# Patient Record
Sex: Female | Born: 1980 | Race: Black or African American | Hispanic: No | Marital: Single | Smoking: Current some day smoker
Health system: Southern US, Community
[De-identification: ages and names within clinical notes are randomized; demographics above are authoritative.]

## PROBLEM LIST (undated history)

## (undated) DIAGNOSIS — D649 Anemia, unspecified: Secondary | ICD-10-CM

## (undated) DIAGNOSIS — F419 Anxiety disorder, unspecified: Secondary | ICD-10-CM

## (undated) DIAGNOSIS — F1910 Other psychoactive substance abuse, uncomplicated: Secondary | ICD-10-CM

## (undated) DIAGNOSIS — F32A Depression, unspecified: Secondary | ICD-10-CM

## (undated) DIAGNOSIS — F329 Major depressive disorder, single episode, unspecified: Secondary | ICD-10-CM

## (undated) DIAGNOSIS — N926 Irregular menstruation, unspecified: Secondary | ICD-10-CM

## (undated) DIAGNOSIS — F1410 Cocaine abuse, uncomplicated: Secondary | ICD-10-CM

## (undated) DIAGNOSIS — D571 Sickle-cell disease without crisis: Secondary | ICD-10-CM

## (undated) HISTORY — DX: Sickle-cell disease without crisis: D57.1

## (undated) HISTORY — PX: FOOT SURGERY: SHX648

## (undated) HISTORY — DX: Anemia, unspecified: D64.9

---

## 2008-09-09 ENCOUNTER — Emergency Department (HOSPITAL_COMMUNITY): Admission: EM | Admit: 2008-09-09 | Discharge: 2008-09-09 | Payer: Self-pay | Admitting: Emergency Medicine

## 2008-09-22 ENCOUNTER — Emergency Department (HOSPITAL_COMMUNITY): Admission: EM | Admit: 2008-09-22 | Discharge: 2008-09-22 | Payer: Self-pay | Admitting: Emergency Medicine

## 2008-10-27 ENCOUNTER — Emergency Department (HOSPITAL_COMMUNITY): Admission: EM | Admit: 2008-10-27 | Discharge: 2008-10-27 | Payer: Self-pay | Admitting: Emergency Medicine

## 2008-11-12 ENCOUNTER — Ambulatory Visit: Payer: Self-pay | Admitting: Internal Medicine

## 2008-11-16 ENCOUNTER — Ambulatory Visit: Payer: Self-pay | Admitting: *Deleted

## 2008-12-20 ENCOUNTER — Encounter: Payer: Self-pay | Admitting: Family Medicine

## 2008-12-20 ENCOUNTER — Ambulatory Visit: Payer: Self-pay | Admitting: Internal Medicine

## 2008-12-20 LAB — CONVERTED CEMR LAB
AST: 18 units/L (ref 0–37)
Albumin: 4.2 g/dL (ref 3.5–5.2)
BUN: 11 mg/dL (ref 6–23)
CO2: 22 meq/L (ref 19–32)
Calcium: 9.1 mg/dL (ref 8.4–10.5)
Chloride: 107 meq/L (ref 96–112)
Cholesterol: 133 mg/dL (ref 0–200)
Cocaine Metabolites: NEGATIVE
Creatinine,U: 190.8 mg/dL
Eosinophils Relative: 1 % (ref 0–5)
HCT: 39 % (ref 36.0–46.0)
HDL: 57 mg/dL (ref >39)
Hemoglobin: 12.9 g/dL (ref 12.0–15.0)
Lymphocytes Relative: 41 % (ref 12–46)
Lymphs Abs: 1.7 10*3/uL (ref 0.7–4.0)
MCV: 86.9 fL (ref 78.0–100.0)
Monocytes Absolute: 0.4 10*3/uL (ref 0.1–1.0)
Monocytes Relative: 9 % (ref 3–12)
Phencyclidine (PCP): NEGATIVE
Platelets: 335 10*3/uL (ref 150–400)
Potassium: 3.9 meq/L (ref 3.5–5.3)
RBC: 4.49 M/uL (ref 3.87–5.11)
TSH: 1.154 microintl units/mL (ref 0.350–4.500)
WBC: 4.1 10*3/uL (ref 4.0–10.5)

## 2008-12-27 ENCOUNTER — Encounter: Payer: Self-pay | Admitting: Family Medicine

## 2008-12-27 ENCOUNTER — Ambulatory Visit: Payer: Self-pay | Admitting: Internal Medicine

## 2008-12-27 LAB — CONVERTED CEMR LAB: Chlamydia, DNA Probe: NEGATIVE

## 2009-04-11 ENCOUNTER — Encounter: Payer: Self-pay | Admitting: Family Medicine

## 2009-04-11 ENCOUNTER — Ambulatory Visit: Payer: Self-pay | Admitting: Family Medicine

## 2009-04-11 LAB — CONVERTED CEMR LAB
Chlamydia, Swab/Urine, PCR: NEGATIVE
GC Probe Amp, Urine: POSITIVE — AB

## 2009-04-21 ENCOUNTER — Ambulatory Visit: Payer: Self-pay | Admitting: Internal Medicine

## 2009-08-03 ENCOUNTER — Emergency Department (HOSPITAL_COMMUNITY): Admission: EM | Admit: 2009-08-03 | Discharge: 2009-08-03 | Payer: Self-pay | Admitting: Emergency Medicine

## 2009-08-24 ENCOUNTER — Ambulatory Visit: Payer: Self-pay | Admitting: Internal Medicine

## 2009-08-25 ENCOUNTER — Encounter (INDEPENDENT_AMBULATORY_CARE_PROVIDER_SITE_OTHER): Payer: Self-pay | Admitting: Internal Medicine

## 2009-08-25 ENCOUNTER — Ambulatory Visit: Payer: Self-pay | Admitting: Family Medicine

## 2009-08-25 LAB — CONVERTED CEMR LAB
BUN: 12 mg/dL (ref 6–23)
CO2: 24 meq/L (ref 19–32)
Chloride: 104 meq/L (ref 96–112)
Creatinine, Ser: 0.8 mg/dL (ref 0.40–1.20)
Glucose, Bld: 100 mg/dL — ABNORMAL HIGH (ref 70–99)

## 2009-09-27 ENCOUNTER — Ambulatory Visit: Payer: Self-pay | Admitting: Internal Medicine

## 2009-10-24 ENCOUNTER — Ambulatory Visit: Payer: Self-pay | Admitting: Family Medicine

## 2009-10-24 ENCOUNTER — Encounter (INDEPENDENT_AMBULATORY_CARE_PROVIDER_SITE_OTHER): Payer: Self-pay | Admitting: Internal Medicine

## 2009-10-24 LAB — CONVERTED CEMR LAB
FSH: 1.9 milliintl units/mL
Ferritin: 25 ng/mL (ref 10–291)
Hgb A1c MFr Bld: 5.4 % (ref <5.7)
Preg, Serum: NEGATIVE

## 2009-11-21 ENCOUNTER — Ambulatory Visit: Payer: Self-pay | Admitting: Internal Medicine

## 2009-12-08 ENCOUNTER — Ambulatory Visit: Payer: Self-pay | Admitting: Internal Medicine

## 2009-12-13 ENCOUNTER — Ambulatory Visit: Payer: Self-pay | Admitting: Internal Medicine

## 2010-01-10 ENCOUNTER — Ambulatory Visit: Payer: Self-pay | Admitting: Internal Medicine

## 2010-01-26 ENCOUNTER — Ambulatory Visit: Payer: Self-pay | Admitting: Internal Medicine

## 2010-06-05 ENCOUNTER — Emergency Department (HOSPITAL_COMMUNITY)
Admission: EM | Admit: 2010-06-05 | Discharge: 2010-06-05 | Payer: Self-pay | Source: Home / Self Care | Admitting: Gastroenterology

## 2010-06-06 LAB — URINALYSIS, ROUTINE W REFLEX MICROSCOPIC
Bilirubin Urine: NEGATIVE
Ketones, ur: NEGATIVE mg/dL
Nitrite: NEGATIVE
Protein, ur: NEGATIVE mg/dL
Urobilinogen, UA: 1 mg/dL (ref 0.0–1.0)

## 2010-06-06 LAB — POCT PREGNANCY, URINE: Preg Test, Ur: NEGATIVE

## 2010-06-07 LAB — GC/CHLAMYDIA PROBE AMP, GENITAL: Chlamydia, DNA Probe: NEGATIVE

## 2010-12-22 ENCOUNTER — Inpatient Hospital Stay (HOSPITAL_COMMUNITY): Payer: Self-pay

## 2010-12-22 ENCOUNTER — Encounter (HOSPITAL_COMMUNITY): Payer: Self-pay | Admitting: *Deleted

## 2010-12-22 ENCOUNTER — Inpatient Hospital Stay (HOSPITAL_COMMUNITY)
Admission: AD | Admit: 2010-12-22 | Discharge: 2010-12-22 | Disposition: A | Discharge status: Home / Self Care | Payer: Self-pay | Source: Ambulatory Visit | Attending: Family Medicine | Admitting: Family Medicine

## 2010-12-22 DIAGNOSIS — N938 Other specified abnormal uterine and vaginal bleeding: Secondary | ICD-10-CM | POA: Insufficient documentation

## 2010-12-22 DIAGNOSIS — N719 Inflammatory disease of uterus, unspecified: Secondary | ICD-10-CM | POA: Insufficient documentation

## 2010-12-22 DIAGNOSIS — N83209 Unspecified ovarian cyst, unspecified side: Secondary | ICD-10-CM | POA: Insufficient documentation

## 2010-12-22 DIAGNOSIS — N949 Unspecified condition associated with female genital organs and menstrual cycle: Secondary | ICD-10-CM | POA: Insufficient documentation

## 2010-12-22 LAB — DIFFERENTIAL
Basophils Absolute: 0 10*3/uL (ref 0.0–0.1)
Basophils Relative: 0 % (ref 0–1)
Monocytes Relative: 8 % (ref 3–12)
Neutro Abs: 2.8 10*3/uL (ref 1.7–7.7)
Neutrophils Relative %: 47 % (ref 43–77)

## 2010-12-22 LAB — CBC
Hemoglobin: 12.9 g/dL (ref 12.0–15.0)
MCHC: 34.6 g/dL (ref 30.0–36.0)

## 2010-12-22 LAB — HCG, SERUM, QUALITATIVE: Preg, Serum: NEGATIVE

## 2010-12-22 LAB — URINALYSIS, ROUTINE W REFLEX MICROSCOPIC
Ketones, ur: NEGATIVE mg/dL
Nitrite: NEGATIVE
Urobilinogen, UA: 1 mg/dL (ref 0.0–1.0)
pH: 6 (ref 5.0–8.0)

## 2010-12-22 LAB — WET PREP, GENITAL: Clue Cells Wet Prep HPF POC: NONE SEEN

## 2010-12-22 LAB — URINE MICROSCOPIC-ADD ON

## 2010-12-22 LAB — POCT PREGNANCY, URINE: Preg Test, Ur: NEGATIVE

## 2010-12-22 MED ORDER — OXYCODONE-ACETAMINOPHEN 5-325 MG PO TABS: 1.0000 | ORAL_TABLET | ORAL | Status: AC | PRN

## 2010-12-22 MED ORDER — AMOXICILLIN-POT CLAVULANATE 875-125 MG PO TABS: 1.0000 | ORAL_TABLET | Freq: Two times a day (BID) | ORAL | Status: AC

## 2010-12-22 MED ORDER — IBUPROFEN 800 MG PO TABS: 800.0000 mg | ORAL_TABLET | Freq: Three times a day (TID) | ORAL | Status: AC

## 2010-12-22 MED ORDER — OXYCODONE-ACETAMINOPHEN 5-325 MG PO TABS
2.0000 | ORAL_TABLET | Freq: Once | ORAL | Status: AC
  Administered 2010-12-22: 2 via ORAL
  Filled 2010-12-22: qty 2

## 2010-12-22 NOTE — ED Provider Notes (Addendum)
History    Patient presents to MAU after having heavy bleeding x 3 weeks; she states she has had to change her pad every 2-3 hours. She states she was having her periods every month, at the beginning, but she has not had a period since May; she began bleeding the end of July, and has been bleeding since. She states initially she was passing large clots, and sometimes now. Pain has not been terrible. She thinks the odor to her bleeding and her vagina in general has been getting more foul smelling and strong in odor over the course of the past few weeks. She has also developed some pain and swelling in her breasts, particularly of the upper inner portion of her RT breast. She has noticed some milk leaking from her Right breast. She did breastfeed her only child, but has not breastfed in over a year. She denies any headaches or changes in vision. She admits to marijuana and cigarette use, and is a social drinker. States her partner is not circumcised, and she is concerned for having STD's; she is worried his penis also seems to have a foul smell to it. She denies fever, nausea, vomiting.  Chief Complaint  Patient presents with  . Vaginal Bleeding   HPI  No past medical history on file.  Past Surgical History  Procedure Date  . No past surgeries   . Cesarean section     FTP    No family history on file.  History  Substance Use Topics  . Smoking status: Current Everyday Smoker -- 0.5 packs/day    Types: Cigarettes  . Smokeless tobacco: Not on file  . Alcohol Use: Yes     social    Allergies: No Known Allergies  Prescriptions prior to admission  Medication Sig Dispense Refill  . ibuprofen (ADVIL,MOTRIN) 100 MG tablet Take 100 mg by mouth every 6 (six) hours as needed. For pain.         Review of Systems  Constitutional: Positive for malaise/fatigue. Negative for fever and chills.  Eyes: Negative for blurred vision and double vision.  Respiratory: Negative for cough.     Cardiovascular: Negative for chest pain.  Gastrointestinal: Positive for abdominal pain. Negative for heartburn, nausea and vomiting.  Genitourinary: Negative for dysuria and hematuria.       Vaginal bleeding x 3 weeks, irregular periods  Musculoskeletal: Negative.   Skin: Negative.   Neurological: Negative for dizziness, weakness and headaches.   Physical Exam   Blood pressure 123/77, pulse 78, temperature 97.8 F (36.6 C), temperature source Oral, resp. rate 16, height 5' 7.5" (1.715 m), weight 108.5 kg (239 lb 3.2 oz), SpO2 100.00%.  Physical Exam  Constitutional: She is oriented to person, place, and time. She appears well-developed and well-nourished. No distress.  HENT:  Head: Normocephalic.  Eyes: Pupils are equal, round, and reactive to light.  Neck: Normal range of motion.  Cardiovascular: Normal rate, regular rhythm, normal heart sounds and intact distal pulses.  Exam reveals no gallop.   No murmur heard. Respiratory: Effort normal and breath sounds normal. No respiratory distress. She has no wheezes. Right breast exhibits nipple discharge and tenderness. Left breast exhibits tenderness. Left breast exhibits no nipple discharge.    GI: Soft. Bowel sounds are normal. She exhibits no distension. There is no tenderness.  Genitourinary:       Uterus palpates to 16 wk size on bimanual, and is tender to palpation; both ovaries tender but no palpated as enlarged; cervical motion  tenderness.  Moderate amount of bleeding noted with some cervical discharge (clear) noted. No vaginal or cervical trauma noted.  Musculoskeletal: Normal range of motion.  Neurological: She is alert and oriented to person, place, and time.  Skin: Skin is warm and dry. She is not diaphoretic.       Scarring noted from ingrown hairs in axilla and in groin area  Psychiatric: She has a normal mood and affect.   Results for orders placed during the hospital encounter of 12/22/10 (from the past 24 hour(s))   URINALYSIS, ROUTINE W REFLEX MICROSCOPIC     Status: Abnormal   Collection Time   12/22/10  4:30 PM      Component Value Range   Color, Urine AMBER (*) YELLOW    Appearance CLOUDY (*) CLEAR    Specific Gravity, Urine 1.020  1.005 - 1.030    pH 6.0  5.0 - 8.0    Glucose, UA NEGATIVE  NEGATIVE (mg/dL)   Hgb urine dipstick LARGE (*) NEGATIVE    Bilirubin Urine SMALL (*) NEGATIVE    Ketones, ur NEGATIVE  NEGATIVE (mg/dL)   Protein, ur 30 (*) NEGATIVE (mg/dL)   Urobilinogen, UA 1.0  0.0 - 1.0 (mg/dL)   Nitrite NEGATIVE  NEGATIVE    Leukocytes, UA TRACE (*) NEGATIVE   URINE MICROSCOPIC-ADD ON     Status: Abnormal   Collection Time   12/22/10  4:30 PM      Component Value Range   Squamous Epithelial / LPF FEW (*) RARE    WBC, UA 0-2  <3 (WBC/hpf)   RBC / HPF TOO NUMEROUS TO COUNT  <3 (RBC/hpf)  POCT PREGNANCY, URINE     Status: Normal   Collection Time   12/22/10  4:43 PM      Component Value Range   Preg Test, Ur NEGATIVE    WET PREP, GENITAL     Status: Abnormal   Collection Time   12/22/10  6:20 PM      Component Value Range   Yeast, Wet Prep NONE SEEN  NONE SEEN    Trich, Wet Prep NONE SEEN  NONE SEEN    Clue Cells, Wet Prep NONE SEEN  NONE SEEN    WBC, Wet Prep HPF POC FEW (*) NONE SEEN   HCG, SERUM, QUALITATIVE     Status: Normal   Collection Time   12/22/10  6:59 PM      Component Value Range   Preg, Serum NEGATIVE  NEGATIVE   CBC     Status: Normal   Collection Time   12/22/10  6:59 PM      Component Value Range   WBC 5.9  4.0 - 10.5 (K/uL)   RBC 4.42  3.87 - 5.11 (MIL/uL)   Hemoglobin 12.9  12.0 - 15.0 (g/dL)   HCT 16.1  09.6 - 04.5 (%)   MCV 84.4  78.0 - 100.0 (fL)   MCH 29.2  26.0 - 34.0 (pg)   MCHC 34.6  30.0 - 36.0 (g/dL)   RDW 40.9  81.1 - 91.4 (%)   Platelets 306  150 - 400 (K/uL)  DIFFERENTIAL     Status: Normal   Collection Time   12/22/10  6:59 PM      Component Value Range   Neutrophils Relative 47  43 - 77 (%)   Neutro Abs 2.8  1.7 - 7.7 (K/uL)    Lymphocytes Relative 43  12 - 46 (%)   Lymphs Abs 2.6  0.7 - 4.0 (  K/uL)   Monocytes Relative 8  3 - 12 (%)   Monocytes Absolute 0.5  0.1 - 1.0 (K/uL)   Eosinophils Relative 1  0 - 5 (%)   Eosinophils Absolute 0.1  0.0 - 0.7 (K/uL)   Basophils Relative 0  0 - 1 (%)   Basophils Absolute 0.0  0.0 - 0.1 (K/uL)     MAU Course  Procedures [place]: IMPRESSION:  1. Normal endometrial thickness. Measures 4 mm.  2. Left ovary has a 4 cm cyst. Consider a follow-up ultrasound in 8  weeks. .  Assessment and Plan  1. Endometritis: Based on foul smell, increased bleeding and marked tenderness on examination. Will tx with Augment x 14 days. Percocet and Motrin 800mg  for pain. Will have clinic contact pt to arrange F/U. 2. Ovarian Cyst: Will need [place] in 8 weeks. Will arrange as outpt. 3. Lactating: Will need to follow clinically. Pt educated to use cool compresses and to not stimulate nipples. Pt may warrant Outpt [place] of breasts if persists, and/or prolactin level (would take 3 days at minimum to get back by what I was told by lab).  Chancy Milroy 12/22/2010, 6:27 PM

## 2010-12-22 NOTE — Progress Notes (Signed)
Pt states "R breast pain x 2wks, vag blding x3wks, changing pads 3-4 times a day"

## 2010-12-22 NOTE — Progress Notes (Signed)
Pt is wearing a full size pad since 1000 today. I/3 covered with dark blood. No active bleeding.

## 2010-12-22 NOTE — Progress Notes (Signed)
Pt states she has had heavy bleeding for 3 weeks, every day. Pt states she changes pads 2-3 pad per day. Noticed a breast lump on the top of her R breast about 2 weeks ago. Can be sore with palpation.

## 2010-12-23 LAB — TSH: TSH: 2.313 u[IU]/mL (ref 0.350–4.500)

## 2010-12-23 LAB — GC/CHLAMYDIA PROBE AMP, GENITAL: Chlamydia, DNA Probe: NEGATIVE

## 2011-01-25 ENCOUNTER — Emergency Department (HOSPITAL_COMMUNITY)
Admission: EM | Admit: 2011-01-25 | Discharge: 2011-01-25 | Disposition: A | Discharge status: Home / Self Care | Payer: No Typology Code available for payment source | Attending: Emergency Medicine | Admitting: Emergency Medicine

## 2011-01-25 ENCOUNTER — Emergency Department (HOSPITAL_COMMUNITY): Payer: No Typology Code available for payment source

## 2011-01-25 ENCOUNTER — Encounter: Payer: Self-pay | Admitting: Obstetrics and Gynecology

## 2011-01-25 ENCOUNTER — Ambulatory Visit (INDEPENDENT_AMBULATORY_CARE_PROVIDER_SITE_OTHER): Payer: Medicaid Other | Admitting: Obstetrics and Gynecology

## 2011-01-25 VITALS — BP 121/80 | HR 71 | Temp 98.0°F | Ht 68.0 in | Wt 237.2 lb

## 2011-01-25 DIAGNOSIS — B9689 Other specified bacterial agents as the cause of diseases classified elsewhere: Secondary | ICD-10-CM

## 2011-01-25 DIAGNOSIS — Z043 Encounter for examination and observation following other accident: Secondary | ICD-10-CM | POA: Insufficient documentation

## 2011-01-25 DIAGNOSIS — R109 Unspecified abdominal pain: Secondary | ICD-10-CM | POA: Insufficient documentation

## 2011-01-25 DIAGNOSIS — N760 Acute vaginitis: Secondary | ICD-10-CM

## 2011-01-25 DIAGNOSIS — A499 Bacterial infection, unspecified: Secondary | ICD-10-CM

## 2011-01-25 DIAGNOSIS — O99891 Other specified diseases and conditions complicating pregnancy: Secondary | ICD-10-CM | POA: Insufficient documentation

## 2011-01-25 LAB — HCG, QUANTITATIVE, PREGNANCY: hCG, Beta Chain, Quant, S: 1 m[IU]/mL (ref <5)

## 2011-01-25 LAB — WET PREP, GENITAL: Yeast Wet Prep HPF POC: NONE SEEN

## 2011-01-25 MED ORDER — METRONIDAZOLE 500 MG PO TABS: 500.0000 mg | ORAL_TABLET | Freq: Two times a day (BID) | ORAL | Status: AC

## 2011-01-29 NOTE — Progress Notes (Signed)
Subjective: Patient is today to followup on ovarian cyst. She states her pain has now resolved. Her only complaint is vaginal discharge with odor. She has no other complaints at this time.  Objective: Patient is alert and oriented x3 in no apparent distress. Vital signs are stable.  Her abdomen is soft and nontender to palpation. She does have normal external female genitalia. He has a very thin vaginal discharge present. Wet prep was performed and was negative. Bimanual examination reveals a normal shape and size uterus. There are no adnexal masses. Patient is nontender on examination.  Assessment and plan: At this time her pain from a prior ovarian cyst has resolved. No further workup is needed at this time. If she begins to have worsening pain or additional symptoms, she will need a repeat ultrasound. She will followup for her next yearly physical examination and Pap smear.  Clinton Gallant. Rice III, DrHSc, MPAS, PA-C

## 2011-02-22 ENCOUNTER — Encounter (HOSPITAL_COMMUNITY): Payer: Self-pay

## 2011-02-22 ENCOUNTER — Inpatient Hospital Stay (HOSPITAL_COMMUNITY)
Admission: AD | Admit: 2011-02-22 | Discharge: 2011-02-22 | Disposition: A | Discharge status: Home / Self Care | Payer: Medicaid Other | Source: Ambulatory Visit | Attending: Obstetrics & Gynecology | Admitting: Obstetrics & Gynecology

## 2011-02-22 DIAGNOSIS — N926 Irregular menstruation, unspecified: Secondary | ICD-10-CM

## 2011-02-22 DIAGNOSIS — Z711 Person with feared health complaint in whom no diagnosis is made: Secondary | ICD-10-CM | POA: Insufficient documentation

## 2011-02-22 HISTORY — DX: Major depressive disorder, single episode, unspecified: F32.9

## 2011-02-22 HISTORY — DX: Depression, unspecified: F32.A

## 2011-02-22 LAB — URINALYSIS, ROUTINE W REFLEX MICROSCOPIC
Glucose, UA: NEGATIVE mg/dL
Leukocytes, UA: NEGATIVE
Protein, ur: NEGATIVE mg/dL

## 2011-02-22 LAB — URINE MICROSCOPIC-ADD ON

## 2011-02-22 NOTE — ED Provider Notes (Signed)
Agree with above note.  Ashdon Gillson H. 02/22/2011 5:20 PM

## 2011-02-22 NOTE — Progress Notes (Signed)
Pt states she does not know her LMP. Started bleeding this am. Pt has tissue on and it is saturated with dark red blood. Pt states she did a HPT that was POS about one week ago. No pain.

## 2011-02-22 NOTE — Progress Notes (Signed)
Pt states bleeding began this am, appeared to be light tissue matter per pt that fell into commode. Last intercourse 3 weeks ago. Denies pain at present. Small smear on pad now, was just changed in restroom before being brought into MAU bed. No pnc as yet. Denies vaginal d/c changes. No clots. Bleeding began at 0200, heavy, has changed x4 since coming in to be evaluated. Home upt+ on Friday, 12/23/2010.

## 2011-02-22 NOTE — ED Provider Notes (Signed)
Alicia Flores [id number] Chief Complaint  Patient presents with  . Vaginal Bleeding    SUBJECTIVE  HPI: Concerned that she began menstrual-like bleeding today and passed tissue and clots this am. Mild menstrual-like cramps. Thinks she was pregnant last month when seen here and quant beta hCG was 1. No pain or irritative vaginal discharge.   Past Medical History  Diagnosis Date  . Anemia   . Sickle cell anemia     sickle cell trait  . Depression     Past Surgical History  Procedure Date  . Cesarean section     FTP   History   Social History  . Marital Status: Single    Spouse Name: N/A    Number of Children: N/A  . Years of Education: N/A   Occupational History  . Not on file.   Social History Main Topics  . Smoking status: Former Smoker -- 0.5 packs/day    Types: Cigarettes    Quit date: 01/11/2011  . Smokeless tobacco: Never Used  . Alcohol Use: No     social  . Drug Use: No  . Sexually Active: Yes    Birth Control/ Protection: None   Other Topics Concern  . Not on file   Social History Narrative  . No narrative on file   No current facility-administered medications on file prior to encounter.   No current outpatient prescriptions on file prior to encounter.   No Known Allergies  ROS: Pertinent items in HPI  OBJECTIVE  BP 111/72  Pulse 83  Temp(Src) 98 F (36.7 C) (Oral)  Resp 20  Ht 5\' 7"  (1.702 m)  Wt 107.684 kg (237 lb 6.4 oz)  BMI 37.18 kg/m2  SpO2 99%  LMP 10/08/2010   Physical Exam   General: WN/WD in NAD Abd: soft, NT  UPT negative  ASSESSMENT  Normal menses onset today   PLAN  Reassured pt.

## 2011-02-23 LAB — POCT PREGNANCY, URINE: Preg Test, Ur: NEGATIVE

## 2011-03-26 ENCOUNTER — Encounter (HOSPITAL_COMMUNITY): Payer: Self-pay | Admitting: Emergency Medicine

## 2011-03-26 ENCOUNTER — Emergency Department (HOSPITAL_COMMUNITY)
Admission: EM | Admit: 2011-03-26 | Discharge: 2011-03-26 | Discharge status: Against Medical Advice | Payer: Self-pay | Attending: Emergency Medicine | Admitting: Emergency Medicine

## 2011-03-26 DIAGNOSIS — R109 Unspecified abdominal pain: Secondary | ICD-10-CM | POA: Insufficient documentation

## 2011-03-26 HISTORY — DX: Anxiety disorder, unspecified: F41.9

## 2011-03-26 NOTE — ED Notes (Signed)
Per ems pt c/o abd pain x 1 week.

## 2011-03-29 ENCOUNTER — Other Ambulatory Visit: Payer: Self-pay

## 2011-03-29 ENCOUNTER — Encounter (HOSPITAL_COMMUNITY): Payer: Self-pay | Admitting: Emergency Medicine

## 2011-03-29 ENCOUNTER — Emergency Department (HOSPITAL_COMMUNITY)
Admission: EM | Admit: 2011-03-29 | Discharge: 2011-03-29 | Disposition: A | Discharge status: Other Acute Inpatient Hospital | Payer: Self-pay | Attending: Emergency Medicine | Admitting: Emergency Medicine

## 2011-03-29 DIAGNOSIS — M79606 Pain in leg, unspecified: Secondary | ICD-10-CM

## 2011-03-29 DIAGNOSIS — F341 Dysthymic disorder: Secondary | ICD-10-CM | POA: Insufficient documentation

## 2011-03-29 DIAGNOSIS — M79609 Pain in unspecified limb: Secondary | ICD-10-CM | POA: Insufficient documentation

## 2011-03-29 DIAGNOSIS — Z1331 Encounter for screening for depression: Secondary | ICD-10-CM | POA: Insufficient documentation

## 2011-03-29 LAB — COMPREHENSIVE METABOLIC PANEL
BUN: 23 mg/dL (ref 6–23)
CO2: 23 mEq/L (ref 19–32)
Chloride: 103 mEq/L (ref 96–112)
Creatinine, Ser: 1.05 mg/dL (ref 0.50–1.10)
GFR calc non Af Amer: 72 mL/min — ABNORMAL LOW (ref >90)
Glucose, Bld: 82 mg/dL (ref 70–99)
Total Bilirubin: 1.3 mg/dL — ABNORMAL HIGH (ref 0.3–1.2)

## 2011-03-29 LAB — URINALYSIS, DIPSTICK ONLY
Glucose, UA: NEGATIVE mg/dL
Hgb urine dipstick: NEGATIVE
Ketones, ur: 40 mg/dL — AB
Protein, ur: NEGATIVE mg/dL

## 2011-03-29 LAB — CBC
Platelets: 283 10*3/uL (ref 150–400)
RBC: 4.96 MIL/uL (ref 3.87–5.11)
WBC: 5.8 10*3/uL (ref 4.0–10.5)

## 2011-03-29 LAB — POCT PREGNANCY, URINE: Preg Test, Ur: NEGATIVE

## 2011-03-29 MED ORDER — IBUPROFEN 800 MG PO TABS
800.0000 mg | ORAL_TABLET | Freq: Once | ORAL | Status: AC
  Administered 2011-03-29: 800 mg via ORAL
  Filled 2011-03-29: qty 1

## 2011-03-29 MED ORDER — IBUPROFEN 600 MG PO TABS: 600.0000 mg | ORAL_TABLET | Freq: Four times a day (QID) | ORAL | Status: AC | PRN

## 2011-03-29 NOTE — ED Notes (Signed)
Pt is from [facility] and brought with ivp papers and officer to get lab work. Pt denies and si or hi states that she was just walking the streets and she has bil foot pain with no injury.no swelling noted, states that she has no medical problems and has never had any med hx per pt.

## 2011-03-29 NOTE — ED Provider Notes (Signed)
History    patient was sent to the ED from [facility] for clearance lab work for placement. Per note, patient has IVC paper. Patient denies suicidal or homicidal ideation. She does complain of bilateral knee pain thigh pain since yesterday. She describes the pain as aching and throbbing, or relieved with rest. She denies swelling, rash. She denies fever, chest pain, shortness of breath. She is not on any birth control pill and denies any recent surgery. She states she has been walking a lot. CSN: [id number] Arrival date & time: 03/29/2011  9:28 AM   First MD Initiated Contact with Patient 03/29/11 1005      Chief Complaint  Patient presents with  . Medical Clearance    (Consider location/radiation/quality/duration/timing/severity/associated sxs/prior treatment) HPI  Past Medical History  Diagnosis Date  . Anemia   . Sickle cell anemia     sickle cell trait  . Depression   . Anxiety     Past Surgical History  Procedure Date  . Cesarean section     FTP    Family History  Problem Relation Age of Onset  . Hypertension Mother     History  Substance Use Topics  . Smoking status: Former Smoker -- 0.5 packs/day    Types: Cigarettes    Quit date: 01/11/2011  . Smokeless tobacco: Never Used  . Alcohol Use: No     social    OB History    Grav Para Term Preterm Abortions TAB SAB Ect Mult Living   3 1 1  1  1   1       Review of Systems  All other systems reviewed and are negative.    Allergies  Review of patient's allergies indicates no known allergies.  Home Medications  No current outpatient prescriptions on file.  BP 112/77  Pulse 84  Temp(Src) 98.1 F (36.7 C) (Oral)  Resp 16  SpO2 100%  LMP 03/19/2011  Physical Exam  Nursing note and vitals reviewed. Constitutional:       Awake, alert, nontoxic appearance  HENT:  Head: Atraumatic.  Eyes: Right eye exhibits no discharge. Left eye exhibits no discharge.  Neck: Neck supple.  Cardiovascular: Normal rate  and regular rhythm.   Pulmonary/Chest: Effort normal and breath sounds normal. She exhibits no tenderness.  Abdominal: Soft. There is no tenderness. There is no rebound.  Musculoskeletal: Normal range of motion. She exhibits no tenderness.       Baseline ROM, no obvious new focal weakness.  Knee DTR intact.  No obvious rash or overlying skin changes to lower extremities.  No calf tenderness or edema noted.     Neurological:       Mental status and motor strength appears baseline for patient and situation  Skin: No rash noted.  Psychiatric: Her speech is normal. Thought content normal. Her affect is blunt. She is withdrawn.    ED Course  Procedures (including critical care time)  Labs Reviewed - No data to display No results found.   No diagnosis found.    MDM  Will obtain clearance lab work as requested by [facility].  Patient has bilateral lower thigh pain with no obvious finding. She is PERC negative.  Pain is musculoskeletal likely secondary to walking. Doubt DVT or  Infection. No joint pain noted. Ibuprofen given.  2:35 PM Pt is medically cleared.  Will discharge back to [facility].         Fayrene Helper, PA 03/29/11 1435

## 2011-03-29 NOTE — ED Notes (Signed)
Pt left with Marketing executive to be transported back to [facility].  Amelia from [place] the transport

## 2011-03-30 NOTE — ED Provider Notes (Signed)
Medical screening examination/treatment/procedure(s) were performed by non-physician practitioner and as supervising physician I was immediately available for consultation/collaboration.   Kensi Karr M Rubye Strohmeyer, DO 03/30/11 1908 

## 2012-01-28 ENCOUNTER — Encounter (HOSPITAL_COMMUNITY): Payer: Self-pay | Admitting: *Deleted

## 2012-01-28 ENCOUNTER — Inpatient Hospital Stay (HOSPITAL_COMMUNITY)
Admission: AD | Admit: 2012-01-28 | Discharge: 2012-01-28 | Disposition: A | Discharge status: Home / Self Care | Payer: Medicaid Other | Source: Ambulatory Visit | Attending: Family Medicine | Admitting: Family Medicine

## 2012-01-28 DIAGNOSIS — N939 Abnormal uterine and vaginal bleeding, unspecified: Secondary | ICD-10-CM

## 2012-01-28 DIAGNOSIS — Z201 Contact with and (suspected) exposure to tuberculosis: Secondary | ICD-10-CM | POA: Insufficient documentation

## 2012-01-28 DIAGNOSIS — N938 Other specified abnormal uterine and vaginal bleeding: Secondary | ICD-10-CM | POA: Insufficient documentation

## 2012-01-28 DIAGNOSIS — N949 Unspecified condition associated with female genital organs and menstrual cycle: Secondary | ICD-10-CM | POA: Insufficient documentation

## 2012-01-28 LAB — URINALYSIS, ROUTINE W REFLEX MICROSCOPIC
Bilirubin Urine: NEGATIVE
Nitrite: NEGATIVE
Protein, ur: NEGATIVE mg/dL
Specific Gravity, Urine: 1.015 (ref 1.005–1.030)
Urobilinogen, UA: 0.2 mg/dL (ref 0.0–1.0)

## 2012-01-28 LAB — POCT PREGNANCY, URINE: Preg Test, Ur: NEGATIVE

## 2012-01-28 LAB — URINE MICROSCOPIC-ADD ON

## 2012-01-28 MED ORDER — TUBERCULIN PPD 5 UNIT/0.1ML ID SOLN
5.0000 [IU] | Freq: Once | INTRADERMAL | Status: AC
  Administered 2012-01-28: 5 [IU] via INTRADERMAL
  Filled 2012-01-28: qty 0.1

## 2012-01-28 NOTE — MAU Provider Note (Signed)
Chart reviewed and agree with management and plan.  

## 2012-01-28 NOTE — MAU Provider Note (Signed)
Chief Complaint: Possible Pregnancy and Vaginal Bleeding   First Provider Initiated Contact with Patient 01/28/12 1426     SUBJECTIVE HPI: Alicia Flores is a 31 y.o. G2P1011 by Unknown LMP who presents to MAU reporting onset of moderate bleeding today. She thinks she is pregnant due to fatigue, pedal edema, increased appetite and abnormal interval btw menstrual cycle other she does not on her last menstrual cycle occurred. Pt is poor historian. Denies abd pain, passage of tissue or clots. Has not taken UPT. Is sexually active, no BC.    When phlebotomist came to room to draw blood she noticed that the patient was coughing. Inquired about cause. Patient reports exposure TB. In same room several x over past two weeks w/ person that she states has been hospitalized at [facility] for TB.   Past Medical History  Diagnosis Date  . Anemia   . Sickle cell anemia     sickle cell trait  . Depression   . Anxiety    OB History    Grav Para Term Preterm Abortions TAB SAB Ect Mult Living   3 1 1  1  1   1      # Outc Date GA Lbr Len/2nd Wgt Sex Del Anes PTL Lv   1 SAB            2 TRM            3 GRA              Past Surgical History  Procedure Date  . Cesarean section     FTP   History   Social History  . Marital Status: Single    Spouse Name: N/A    Number of Children: N/A  . Years of Education: N/A   Occupational History  . Not on file.   Social History Main Topics  . Smoking status: Former Smoker -- 0.5 packs/day    Types: Cigarettes    Quit date: 01/11/2011  . Smokeless tobacco: Never Used  . Alcohol Use: No     social  . Drug Use: No  . Sexually Active: Yes    Birth Control/ Protection: None   Other Topics Concern  . Not on file   Social History Narrative  . No narrative on file   No current facility-administered medications on file prior to encounter.   Current Outpatient Prescriptions on File Prior to Encounter  Medication Sig Dispense Refill  . QUEtiapine Fumarate  (SEROQUEL PO) Take 1 tablet by mouth daily.       No Known Allergies  ROS: Pertinent items in HPI  OBJECTIVE Blood pressure 116/80, pulse 80, temperature 98.7 F (37.1 C), temperature source Oral, resp. rate 18, height 5' 7.5" (1.715 m), weight 123.288 kg (271 lb 12.8 oz), last menstrual period 01/28/2012, SpO2 100.00%. GENERAL: Well-developed, well-nourished female in no acute distress.  HEENT: Normocephalic HEART: normal rate RESP: normal effort ABDOMEN: Soft, non-tender EXTREMITIES: Nontender, no edema NEURO: Alert and oriented SPECULUM EXAM: deferred. Small amount of BRB on pad. Pelvic exam deferred due to neg UPT, suspected menses.  LAB RESULTS Results for orders placed during the hospital encounter of 01/28/12 (from the past 24 hour(s))  URINALYSIS, ROUTINE W REFLEX MICROSCOPIC     Status: Abnormal   Collection Time   01/28/12 12:10 PM      Component Value Range   Color, Urine YELLOW  YELLOW   APPearance CLEAR  CLEAR   Specific Gravity, Urine 1.015  1.005 - 1.030  pH 5.5  5.0 - 8.0   Glucose, UA NEGATIVE  NEGATIVE mg/dL   Hgb urine dipstick LARGE (*) NEGATIVE   Bilirubin Urine NEGATIVE  NEGATIVE   Ketones, ur NEGATIVE  NEGATIVE mg/dL   Protein, ur NEGATIVE  NEGATIVE mg/dL   Urobilinogen, UA 0.2  0.0 - 1.0 mg/dL   Nitrite NEGATIVE  NEGATIVE   Leukocytes, UA NEGATIVE  NEGATIVE  URINE MICROSCOPIC-ADD ON     Status: Normal   Collection Time   01/28/12 12:10 PM      Component Value Range   Squamous Epithelial / LPF RARE  RARE   RBC / HPF TOO NUMEROUS TO COUNT  <3 RBC/hpf  POCT PREGNANCY, URINE     Status: Normal   Collection Time   01/28/12 12:20 PM      Component Value Range   Preg Test, Ur NEGATIVE  NEGATIVE    IMAGING No results found.  ED COURSE Consulted with Melissa with infection control. She called [facility] to investigate patient's reported TB exposure. Patient to [facility] has not been diagnosed with or suspected of having TB. PPD performed per  patient's request in maternity admissions.  ASSESSMENT 1. Abnormal uterine bleeding   2. 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 In 2 days to have PPD read.   Contact information:   [address] [place] [address] [phone]      Follow up with Gynecologist . (As needed if cycles continue to be abnormal)       Follow up with [facility] ED. (for worsening cough, fever, night sweats)    Contact information:   [address] [place] [address]           Medication List     As of 01/28/2012  9:00 PM    TAKE these medications         ibuprofen 200 MG tablet   Commonly known as: ADVIL,MOTRIN   Take 400 mg by mouth every 6 (six) hours as needed.      SEROQUEL PO   Take 1 tablet by mouth daily.         [place], [place] 01/28/2012  2:37 PM

## 2012-01-28 NOTE — MAU Note (Addendum)
Charna Archer, RN paged in infection control to consult about PPD being read.  Awaiting return call.  Returned call with Charna Archer ref. Info on person pt was possibly exposed with TB.  Infection control will investigate and report back to RN with findings.

## 2012-01-28 NOTE — MAU Note (Signed)
Patient states her period was late and started early this am. Thinks she might be pregnant. Has a mild left lower abdominal pain.

## 2012-06-17 ENCOUNTER — Emergency Department (HOSPITAL_COMMUNITY)
Admission: EM | Admit: 2012-06-17 | Discharge: 2012-06-18 | Disposition: A | Discharge status: Home / Self Care | Payer: Medicaid Other | Attending: Emergency Medicine | Admitting: Emergency Medicine

## 2012-06-17 ENCOUNTER — Emergency Department (HOSPITAL_COMMUNITY): Payer: Medicaid Other

## 2012-06-17 ENCOUNTER — Encounter (HOSPITAL_COMMUNITY): Payer: Self-pay

## 2012-06-17 DIAGNOSIS — F3289 Other specified depressive episodes: Secondary | ICD-10-CM | POA: Insufficient documentation

## 2012-06-17 DIAGNOSIS — Z8659 Personal history of other mental and behavioral disorders: Secondary | ICD-10-CM | POA: Insufficient documentation

## 2012-06-17 DIAGNOSIS — M545 Low back pain, unspecified: Secondary | ICD-10-CM | POA: Insufficient documentation

## 2012-06-17 DIAGNOSIS — Z862 Personal history of diseases of the blood and blood-forming organs and certain disorders involving the immune mechanism: Secondary | ICD-10-CM | POA: Insufficient documentation

## 2012-06-17 DIAGNOSIS — Z79899 Other long term (current) drug therapy: Secondary | ICD-10-CM | POA: Insufficient documentation

## 2012-06-17 DIAGNOSIS — M542 Cervicalgia: Secondary | ICD-10-CM | POA: Insufficient documentation

## 2012-06-17 DIAGNOSIS — Z87891 Personal history of nicotine dependence: Secondary | ICD-10-CM | POA: Insufficient documentation

## 2012-06-17 DIAGNOSIS — F329 Major depressive disorder, single episode, unspecified: Secondary | ICD-10-CM | POA: Insufficient documentation

## 2012-06-17 DIAGNOSIS — IMO0002 Reserved for concepts with insufficient information to code with codable children: Secondary | ICD-10-CM | POA: Insufficient documentation

## 2012-06-17 DIAGNOSIS — F1210 Cannabis abuse, uncomplicated: Secondary | ICD-10-CM | POA: Insufficient documentation

## 2012-06-17 LAB — RAPID URINE DRUG SCREEN, HOSP PERFORMED: Benzodiazepines: NOT DETECTED

## 2012-06-17 LAB — PREGNANCY, URINE: Preg Test, Ur: NEGATIVE

## 2012-06-17 LAB — CBC WITH DIFFERENTIAL/PLATELET
Eosinophils Relative: 0 % (ref 0–5)
HCT: 40.1 % (ref 36.0–46.0)
Hemoglobin: 13.7 g/dL (ref 12.0–15.0)
Lymphocytes Relative: 33 % (ref 12–46)
Lymphs Abs: 1.6 10*3/uL (ref 0.7–4.0)
MCV: 81.5 fL (ref 78.0–100.0)
Monocytes Relative: 8 % (ref 3–12)
Platelets: 408 10*3/uL — ABNORMAL HIGH (ref 150–400)
RBC: 4.92 MIL/uL (ref 3.87–5.11)
WBC: 4.7 10*3/uL (ref 4.0–10.5)

## 2012-06-17 LAB — COMPREHENSIVE METABOLIC PANEL
ALT: 8 U/L (ref 0–35)
Alkaline Phosphatase: 50 U/L (ref 39–117)
CO2: 23 mEq/L (ref 19–32)
Calcium: 9 mg/dL (ref 8.4–10.5)
GFR calc Af Amer: 88 mL/min — ABNORMAL LOW (ref >90)
GFR calc non Af Amer: 76 mL/min — ABNORMAL LOW (ref >90)
Glucose, Bld: 89 mg/dL (ref 70–99)
Sodium: 136 mEq/L (ref 135–145)

## 2012-06-17 LAB — ETHANOL: Alcohol, Ethyl (B): <11 mg/dL (ref 0–11)

## 2012-06-17 MED ORDER — LORAZEPAM 1 MG PO TABS
1.0000 mg | ORAL_TABLET | Freq: Three times a day (TID) | ORAL | Status: DC | PRN
  Administered 2012-06-17: 1 mg via ORAL
  Filled 2012-06-17: qty 1

## 2012-06-17 MED ORDER — ZIPRASIDONE MESYLATE 20 MG IM SOLR: 10.0000 mg | Freq: Once | INTRAMUSCULAR | Status: DC

## 2012-06-17 NOTE — ED Notes (Signed)
Patient very slow to answer questions asked. Patient denies HI/SI. Patient did say that she smoked marijuana this AM. Patient denies verbal or auditory hallucinations.

## 2012-06-17 NOTE — BH Assessment (Signed)
Assessment Note   Alicia Flores is a 32 y.o. female who initially presented to [facility] with c/o chest pain. This writer attempted to assess pt, however pt is incoherent and unable to respond to questions from this Clinical research associate.  The following information is collateral and pt will require further evaluation by Dr. Elsie Saas.  Pt allegedly was refusing to answer questions from medical staff and not able to follow commands.  Pt has poor eye contact with this Clinical research associate and is incoherent, only able to provide one word answers and repeatedly states--"I hurt, I hurt all over".  Pt does not appear agitated but is confused, when asked if she knows where she is, pt says---"they tell me the hospital".  Pt denies SI/HI and denies AVH, has hx of depression and anxiety.  Pt has been able to follow simple commands from the psych emerg dept nurse and has been cooperative.  Pt is drowsy and was falling asleep during assessment.  Pt will need further evaluation from on-site psychiatrist to determine final disposition.   Axis I: Substance Induced Mood Disorder Axis II: Deferred Axis III:  Past Medical History  Diagnosis Date  . Anemia   . Sickle cell anemia     sickle cell trait  . Depression   . Anxiety    Axis IV: other psychosocial or environmental problems, problems related to social environment and problems with primary support group Axis V: 41-50 serious symptoms  Past Medical History:  Past Medical History  Diagnosis Date  . Anemia   . Sickle cell anemia     sickle cell trait  . Depression   . Anxiety     Past Surgical History  Procedure Date  . Cesarean section     FTP    Family History:  Family History  Problem Relation Age of Onset  . Hypertension Mother     Social History:  reports that she quit smoking about 17 months ago. Her smoking use included Cigarettes. She smoked .5 packs per day. She has never used smokeless tobacco. She reports that she uses illicit drugs (Marijuana). She reports that  she does not drink alcohol.  Additional Social History:  Alcohol / Drug Use Pain Medications: None  Prescriptions: None  Over the Counter: None  History of alcohol / drug use?: Yes Longest period of sobriety (when/how long): None  Substance #1 Name of Substance 1: THC  1 - Age of First Use: Unk  1 - Amount (size/oz): Unk  1 - Frequency: Unk  1 - Duration: Unk  1 - Last Use / Amount: Unk   CIWA: CIWA-Ar BP: 104/69 mmHg Pulse Rate: 57  COWS:    Allergies: No Known Allergies  Home Medications:  (Not in a hospital admission)  OB/GYN Status:  Patient's last menstrual period was 06/11/2012.  General Assessment Data Location of Assessment: [facility] Living Arrangements: Alone Can pt return to current living arrangement?: Yes Admission Status: Voluntary Is patient capable of signing voluntary admission?: Yes Transfer from: Acute Hospital Referral Source: MD  Education Status Is patient currently in school?: No Current Grade: None  Highest grade of school patient has completed: None  Name of school: None  Contact person: None   Risk to self Suicidal Ideation: No Suicidal Intent: No Is patient at risk for suicide?: No Suicidal Plan?: No Access to Means: No What has been your use of drugs/alcohol within the last 12 months?: Abusing: THC  Previous Attempts/Gestures: No How many times?: 0  Other Self Harm Risks: None  Triggers for Past Attempts: None known Intentional Self Injurious Behavior: None Family Suicide History: No Recent stressful life event(s): Other (Comment) (None Reported ) Persecutory voices/beliefs?: No Depression: No Depression Symptoms:  (None reported ) Substance abuse history and/or treatment for substance abuse?: Yes Suicide prevention information given to non-admitted patients: Not applicable  Risk to Others Homicidal Ideation: No Thoughts of Harm to Others: No Current Homicidal Intent: No Current Homicidal Plan: No Access to Homicidal Means:  No Identified Victim: None  History of harm to others?: No Assessment of Violence: None Noted Violent Behavior Description: None  Does patient have access to weapons?: No Criminal Charges Pending?: No Does patient have a court date: No  Psychosis Hallucinations: None noted Delusions: None noted  Mental Status Report Appear/Hygiene: Disheveled Eye Contact: Poor Motor Activity: Other (Comment) (walking slow with ER RN escort) Speech: Slow;Soft;Incoherent Level of Consciousness: Drowsy Mood: Other (Comment) (UTA) Affect: Unable to Assess Anxiety Level: None Thought Processes: Relevant Judgement: Impaired Orientation: Person;Place;Time;Situation Obsessive Compulsive Thoughts/Behaviors: None  Cognitive Functioning Concentration: Decreased Memory: Recent Impaired;Remote Intact IQ: Average Insight: Fair Impulse Control: Fair Appetite: Good Weight Loss: 0  Weight Gain: 0  Sleep: No Change Total Hours of Sleep: 6  Vegetative Symptoms: None  ADLScreening [facility]) Patient's cognitive ability adequate to safely complete daily activities?: Yes Patient able to express need for assistance with ADLs?: Yes Independently performs ADLs?: Yes (appropriate for developmental age)  Abuse/Neglect [facility]) Physical Abuse: Denies Verbal Abuse: Denies Sexual Abuse: Denies  Prior Inpatient Therapy Prior Inpatient Therapy: No Prior Therapy Dates: None  Prior Therapy Facilty/Provider(s): None  Reason for Treatment: None   Prior Outpatient Therapy Prior Outpatient Therapy: No Prior Therapy Dates: None  Prior Therapy Facilty/Provider(s): None  Reason for Treatment: None   ADL Screening (condition at time of admission) Patient's cognitive ability adequate to safely complete daily activities?: Yes Patient able to express need for assistance with ADLs?: Yes Independently performs ADLs?: Yes (appropriate for developmental age) Weakness of Legs: None Weakness of  Arms/Hands: None  Home Assistive Devices/Equipment Home Assistive Devices/Equipment: None  Therapy Consults (therapy consults require a physician order) PT Evaluation Needed: No OT Evalulation Needed: No SLP Evaluation Needed: No Abuse/Neglect Assessment (Assessment to be complete while patient is alone) Physical Abuse: Denies Verbal Abuse: Denies Sexual Abuse: Denies Exploitation of patient/patient's resources: Denies Self-Neglect: Denies Values / Beliefs Cultural Requests During Hospitalization: None Spiritual Requests During Hospitalization: None Consults Spiritual Care Consult Needed: No Social Work Consult Needed: No Merchant navy officer (For Healthcare) Advance Directive: Patient does not have advance directive;Patient would not like information Pre-existing out of facility DNR order (yellow form or pink MOST form): No Nutrition Screen- [facility] Patient's home diet: Regular Have you recently lost weight without trying?: No Have you been eating poorly because of a decreased appetite?: No Malnutrition Screening Tool Score: 0   Additional Information 1:1 In Past 12 Months?: No CIRT Risk: No Elopement Risk: No Does patient have medical clearance?: Yes     Disposition:  Disposition Disposition of Patient: Referred to (Pending eval by Dr. Elsie Saas ) Patient referred to: Other (Comment) (Pending Eval by Dr. Elsie Saas )  On Site Evaluation by:   Reviewed with Physician:     Murrell Redden 06/17/2012 11:09 PM

## 2012-06-17 NOTE — ED Provider Notes (Addendum)
History     CSN: [id number]  Arrival date & time 06/17/12  1418   First MD Initiated Contact with Patient 06/17/12 1457      Chief Complaint  Patient presents with  . Medical Clearance  . Back Pain    (Consider location/radiation/quality/duration/timing/severity/associated sxs/prior treatment) HPI Comments: Patient brought to the ER by EMS. It is not clear why she is here. Patient is refusing to answer any questions. The original EMS call was reportedly for chest pain, but she denied chest pain to EMS when they arrived. She did reportedly tell EMS she was having low back pain, but by the time she arrived to the ER she is no longer answering questions. Patient does not make eye contact. She will not follow any commands. He appears agitated and is tearful.  Patient is a 32 y.o. female presenting with back pain.  Back Pain     Past Medical History  Diagnosis Date  . Anemia   . Sickle cell anemia     sickle cell trait  . Depression   . Anxiety     Past Surgical History  Procedure Date  . Cesarean section     FTP    Family History  Problem Relation Age of Onset  . Hypertension Mother     History  Substance Use Topics  . Smoking status: Former Smoker -- 0.5 packs/day    Types: Cigarettes    Quit date: 01/11/2011  . Smokeless tobacco: Never Used  . Alcohol Use: No     Comment: social    OB History    Grav Para Term Preterm Abortions TAB SAB Ect Mult Living   3 1 1  1  1   1       Review of Systems  Unable to perform ROS Musculoskeletal: Positive for back pain.    Allergies  Review of patient's allergies indicates no known allergies.  Home Medications   Current Outpatient Rx  Name  Route  Sig  Dispense  Refill  . IBUPROFEN 200 MG PO TABS   Oral   Take 400 mg by mouth every 6 (six) hours as needed.         . SEROQUEL PO   Oral   Take 1 tablet by mouth daily.           LMP 06/11/2012  Physical Exam  Constitutional: She is oriented to person,  place, and time. She appears well-developed and well-nourished. No distress.  HENT:  Head: Normocephalic and atraumatic.  Right Ear: Hearing normal.  Nose: Nose normal.  Mouth/Throat: Oropharynx is clear and moist and mucous membranes are normal.  Eyes: Conjunctivae normal and EOM are normal. Pupils are equal, round, and reactive to light.  Neck: Normal range of motion. Neck supple.  Cardiovascular: Normal rate, regular rhythm, S1 normal and S2 normal.  Exam reveals no gallop and no friction rub.   No murmur heard. Pulmonary/Chest: Effort normal and breath sounds normal. No respiratory distress. She exhibits no tenderness.  Abdominal: Soft. Normal appearance and bowel sounds are normal. There is no hepatosplenomegaly. There is no tenderness. There is no rebound, no guarding, no tenderness at McBurney's point and negative Murphy's sign. No hernia.  Musculoskeletal: Normal range of motion.  Neurological: She is alert and oriented to person, place, and time. She has normal strength. No cranial nerve deficit or sensory deficit. Coordination normal. GCS eye subscore is 4. GCS verbal subscore is 5. GCS motor subscore is 6.  Skin: Skin is warm, dry  and intact. No rash noted. No cyanosis.  Psychiatric: Her mood appears anxious. She is agitated. She is noncommunicative.    ED Course  Procedures (including critical care time)   Date: 06/17/2012  Rate: 110  Rhythm: sinus tachycardia  QRS Axis: normal  Intervals: normal  ST/T Wave abnormalities: normal  Conduction Disutrbances:none  Narrative Interpretation:   Old EKG Reviewed: unchanged    Labs Reviewed  CBC WITH DIFFERENTIAL - Abnormal; Notable for the following:    Platelets 408 (*)     All other components within normal limits  COMPREHENSIVE METABOLIC PANEL - Abnormal; Notable for the following:    GFR calc non Af Amer 76 (*)     GFR calc Af Amer 88 (*)     All other components within normal limits  URINE RAPID DRUG SCREEN (HOSP  PERFORMED) - Abnormal; Notable for the following:    Tetrahydrocannabinol POSITIVE (*)     All other components within normal limits  ETHANOL  PREGNANCY, URINE   Ct Head Wo Contrast  06/17/2012  *RADIOLOGY REPORT*  Clinical Data:  Altered mental status  CT HEAD WITHOUT CONTRAST CT CERVICAL SPINE WITHOUT CONTRAST  Technique:  Multidetector CT imaging of the head and cervical spine was performed following the standard protocol without intravenous contrast.  Multiplanar CT image reconstructions of the cervical spine were also generated.  Comparison:   None  CT HEAD  Findings: No mass effect, midline shift, or acute intracranial hemorrhage.  Brain parenchyma and ventricles system are within normal limits.  Mastoid air cells are clear.  Cranium is intact.  IMPRESSION: Negative head CT.  CT CERVICAL SPINE  Findings: No acute fracture.  No dislocation.  Reversal of the normally lordotic cervical spine.  No obvious soft tissue injury. Unremarkable thyroid gland.  IMPRESSION: No acute bony injury in the cervical spine.   Original Report Authenticated By: Jolaine Click, M.D.    Ct Cervical Spine Wo Contrast  06/17/2012  *RADIOLOGY REPORT*  Clinical Data:  Altered mental status  CT HEAD WITHOUT CONTRAST CT CERVICAL SPINE WITHOUT CONTRAST  Technique:  Multidetector CT imaging of the head and cervical spine was performed following the standard protocol without intravenous contrast.  Multiplanar CT image reconstructions of the cervical spine were also generated.  Comparison:   None  CT HEAD  Findings: No mass effect, midline shift, or acute intracranial hemorrhage.  Brain parenchyma and ventricles system are within normal limits.  Mastoid air cells are clear.  Cranium is intact.  IMPRESSION: Negative head CT.  CT CERVICAL SPINE  Findings: No acute fracture.  No dislocation.  Reversal of the normally lordotic cervical spine.  No obvious soft tissue injury. Unremarkable thyroid gland.  IMPRESSION: No acute bony injury in the  cervical spine.   Original Report Authenticated By: Jolaine Click, M.D.      Diagnosis: Mental status changes    MDM  Process to the ER for evaluation by EMS. He was not clear why she came to the ER initially. She reportedly called EMS is that she had chest pain, but when they arrived she denied it. She did tell EMS that she was experiencing back pain, but on arrival she wouldn't answer any questions. Eventually she did consent to treatment and denies back pain but said she was having neck pain. Patient is very evasive, intermittently does answer questions at all. She becomes tearful when spoken to. Her workup has been entirely unremarkable. I believe her condition is primarily psychiatric and therefore she will be moved  to the psychiatric ER the area for further psychiatric evaluation.       Gilda Crease, MD 06/17/12 1910  Gilda Crease, MD 06/18/12 [phone]

## 2012-06-17 NOTE — ED Notes (Addendum)
Per EMS Original call came for chest pain and when EMS arrived, patient denied having CP. Patient could not recall her name when asked. Patient would not or could not answer any questions. When questioned, patient c/o low back pain. Patient has a history of sickle cell. Patient also stated that she smoked marijuana this AM.

## 2012-06-17 NOTE — ED Notes (Signed)
Patient had a pocket knife and was given to [facility].

## 2012-06-17 NOTE — ED Notes (Signed)
2 bags in locker 35

## 2012-06-18 ENCOUNTER — Inpatient Hospital Stay (HOSPITAL_COMMUNITY): Admission: AD | Admit: 2012-06-18 | Payer: Medicaid Other | Source: Intra-hospital | Admitting: Psychiatry

## 2012-06-18 NOTE — ED Provider Notes (Signed)
Patient brought in yesterday for unclear reason.  EDP determined it was likely psychiatric and patient with thc in uds.  Patient tells me she had chest pain.  She denies any current pain.  She states she has a caregiver who is out of town.  She is unclear why she has a caregiver.  She denies suicidality or homicidal intention.  She denies any psychotic symptoms.  She states she is treated for depression but denies any worsening of symptoms.  Patient feels she can go home if we can find her money.   Plan to arrange for transport home.  Patient advised to follow up with her mental health care provider.    Hilario Quarry, MD 06/18/12 [phone]

## 2012-06-19 ENCOUNTER — Encounter (HOSPITAL_COMMUNITY): Payer: Self-pay | Admitting: Emergency Medicine

## 2012-06-19 ENCOUNTER — Emergency Department (HOSPITAL_COMMUNITY)
Admission: EM | Admit: 2012-06-19 | Discharge: 2012-06-20 | Disposition: A | Discharge status: Home / Self Care | Payer: Medicaid Other | Attending: Emergency Medicine | Admitting: Emergency Medicine

## 2012-06-19 DIAGNOSIS — F1210 Cannabis abuse, uncomplicated: Secondary | ICD-10-CM | POA: Insufficient documentation

## 2012-06-19 DIAGNOSIS — Z87891 Personal history of nicotine dependence: Secondary | ICD-10-CM | POA: Insufficient documentation

## 2012-06-19 DIAGNOSIS — F29 Unspecified psychosis not due to a substance or known physiological condition: Secondary | ICD-10-CM | POA: Insufficient documentation

## 2012-06-19 DIAGNOSIS — Z862 Personal history of diseases of the blood and blood-forming organs and certain disorders involving the immune mechanism: Secondary | ICD-10-CM | POA: Insufficient documentation

## 2012-06-19 DIAGNOSIS — Z8659 Personal history of other mental and behavioral disorders: Secondary | ICD-10-CM | POA: Insufficient documentation

## 2012-06-19 LAB — RAPID URINE DRUG SCREEN, HOSP PERFORMED
Amphetamines: NOT DETECTED
Barbiturates: NOT DETECTED
Benzodiazepines: NOT DETECTED
Cocaine: NOT DETECTED
Opiates: NOT DETECTED
Tetrahydrocannabinol: POSITIVE — AB

## 2012-06-19 LAB — CBC WITH DIFFERENTIAL/PLATELET
Basophils Absolute: 0 10*3/uL (ref 0.0–0.1)
Basophils Relative: 0 % (ref 0–1)
Eosinophils Absolute: 0 10*3/uL (ref 0.0–0.7)
Eosinophils Relative: 0 % (ref 0–5)
HCT: 40.5 % (ref 36.0–46.0)
Hemoglobin: 14 g/dL (ref 12.0–15.0)
Lymphocytes Relative: 29 % (ref 12–46)
Lymphs Abs: 2.1 10*3/uL (ref 0.7–4.0)
MCH: 28.1 pg (ref 26.0–34.0)
MCHC: 34.6 g/dL (ref 30.0–36.0)
MCV: 81.3 fL (ref 78.0–100.0)
Monocytes Absolute: 0.5 10*3/uL (ref 0.1–1.0)
Monocytes Relative: 7 % (ref 3–12)
Neutro Abs: 4.5 10*3/uL (ref 1.7–7.7)
Neutrophils Relative %: 64 % (ref 43–77)
Platelets: 413 10*3/uL — ABNORMAL HIGH (ref 150–400)
RBC: 4.98 MIL/uL (ref 3.87–5.11)
RDW: 13.6 % (ref 11.5–15.5)
WBC: 7.1 10*3/uL (ref 4.0–10.5)

## 2012-06-19 LAB — URINALYSIS, ROUTINE W REFLEX MICROSCOPIC
Glucose, UA: NEGATIVE mg/dL
Leukocytes, UA: NEGATIVE
Specific Gravity, Urine: 1.02 (ref 1.005–1.030)
pH: 5.5 (ref 5.0–8.0)

## 2012-06-19 LAB — BASIC METABOLIC PANEL
BUN: 11 mg/dL (ref 6–23)
CO2: 25 mEq/L (ref 19–32)
Calcium: 9.4 mg/dL (ref 8.4–10.5)
Chloride: 101 mEq/L (ref 96–112)
Creatinine, Ser: 1.03 mg/dL (ref 0.50–1.10)
GFR calc Af Amer: 84 mL/min — ABNORMAL LOW (ref >90)
GFR calc non Af Amer: 73 mL/min — ABNORMAL LOW (ref >90)
Glucose, Bld: 118 mg/dL — ABNORMAL HIGH (ref 70–99)
Potassium: 4.1 mEq/L (ref 3.5–5.1)
Sodium: 137 mEq/L (ref 135–145)

## 2012-06-19 LAB — ETHANOL: Alcohol, Ethyl (B): <11 mg/dL (ref 0–11)

## 2012-06-19 LAB — ACETAMINOPHEN LEVEL: Acetaminophen (Tylenol), Serum: <15 ug/mL (ref 10–30)

## 2012-06-19 NOTE — ED Provider Notes (Signed)
History     CSN: [id number]  Arrival date & time 06/19/12  1807   First MD Initiated Contact with Patient 06/19/12 1934      Chief Complaint  Patient presents with  . Altered Mental Status     The history is provided by the patient and the EMS personnel. The history is limited by the condition of the patient (psychosis).  Pt was seen at 1245.  Per EMS report, pt came to the ED today due to AMS.  Pt states she smoked some marijuana today.  Intermittently laughs and says "it hurts" and "I need help."  Pt does have hx of unknown mental illness, states she "is supposed to see" a mental health provider monthly but "hasn't really."  States she "isn't sure" if she is supposed to be taking psychiatric medications.  Denies HI, no SI, no SA. Pt was in the ED 2 days ago with similar symptoms.  Pt was also admitted to [facility] under IVC approx 3 months ago but "can't remember why."    Past Medical History  Diagnosis Date  . Anemia   . Sickle cell anemia     sickle cell trait  . Depression   . Anxiety     Past Surgical History  Procedure Date  . Cesarean section     FTP    Family History  Problem Relation Age of Onset  . Hypertension Mother     History  Substance Use Topics  . Smoking status: Former Smoker -- 0.5 packs/day    Types: Cigarettes    Quit date: 01/11/2011  . Smokeless tobacco: Never Used  . Alcohol Use: No     Comment: social    OB History    Grav Para Term Preterm Abortions TAB SAB Ect Mult Living   3 1 1  1  1   1       Review of Systems  Unable to perform ROS: Psychiatric disorder      Allergies  Review of patient's allergies indicates no known allergies.  Home Medications  No current outpatient prescriptions on file.  LMP 06/11/2012  Physical Exam 2050: Physical examination:  Nursing notes reviewed; Vital signs and O2 SAT reviewed;  Constitutional: Well developed, Well nourished, Well hydrated, In no acute distress; Head:  Normocephalic,  atraumatic; Eyes: EOMI, PERRL, No scleral icterus; ENMT: Mouth and pharynx normal, Mucous membranes moist; Neck: Supple, Full range of motion; Cardiovascular: Regular rate and rhythm, No gallop; Respiratory: Breath sounds clear & equal bilaterally, No wheezes.  Speaking full sentences with ease, Normal respiratory effort/excursion; Chest: Nontender, Movement normal; Abdomen: Soft, Nontender, Nondistended, Normal bowel sounds;; Extremities: Pulses normal, No deformity, No edema, No calf edema or asymmetry.; Neuro: Awake, alert, confused. Major CN grossly intact.  Speech clear. No facial droop. No gross focal motor or sensory deficits in extremities.; Skin: Color normal, Warm, Dry.; Psych:  Inappropriate affect, laughing at times, looking away and talking to the wall, appears to be responding to internal stimuli.     ED Course  Procedures    MDM  MDM Reviewed: nursing note, vitals and previous chart Interpretation: labs   Results for orders placed during the hospital encounter of 06/19/12  CBC WITH DIFFERENTIAL      Component Value Range   WBC 7.1  4.0 - 10.5 K/uL   RBC 4.98  3.87 - 5.11 MIL/uL   Hemoglobin 14.0  12.0 - 15.0 g/dL   HCT 24.4  01.0 - 27.2 %   MCV 81.3  78.0 - 100.0 fL   MCH 28.1  26.0 - 34.0 pg   MCHC 34.6  30.0 - 36.0 g/dL   RDW 96.2  95.2 - 84.1 %   Platelets 413 (*) 150 - 400 K/uL   Neutrophils Relative 64  43 - 77 %   Neutro Abs 4.5  1.7 - 7.7 K/uL   Lymphocytes Relative 29  12 - 46 %   Lymphs Abs 2.1  0.7 - 4.0 K/uL   Monocytes Relative 7  3 - 12 %   Monocytes Absolute 0.5  0.1 - 1.0 K/uL   Eosinophils Relative 0  0 - 5 %   Eosinophils Absolute 0.0  0.0 - 0.7 K/uL   Basophils Relative 0  0 - 1 %   Basophils Absolute 0.0  0.0 - 0.1 K/uL  BASIC METABOLIC PANEL      Component Value Range   Sodium 137  135 - 145 mEq/L   Potassium 4.1  3.5 - 5.1 mEq/L   Chloride 101  96 - 112 mEq/L   CO2 25  19 - 32 mEq/L   Glucose, Bld 118 (*) 70 - 99 mg/dL   BUN 11  6 - 23  mg/dL   Creatinine, Ser 3.24  0.50 - 1.10 mg/dL   Calcium 9.4  8.4 - 40.1 mg/dL   GFR calc non Af Amer 73 (*) >90 mL/min   GFR calc Af Amer 84 (*) >90 mL/min  URINE RAPID DRUG SCREEN (HOSP PERFORMED)      Component Value Range   Opiates NONE DETECTED  NONE DETECTED   Cocaine NONE DETECTED  NONE DETECTED   Benzodiazepines NONE DETECTED  NONE DETECTED   Amphetamines NONE DETECTED  NONE DETECTED   Tetrahydrocannabinol POSITIVE (*) NONE DETECTED   Barbiturates NONE DETECTED  NONE DETECTED  ETHANOL      Component Value Range   Alcohol, Ethyl (B) <11  0 - 11 mg/dL  URINALYSIS, ROUTINE W REFLEX MICROSCOPIC      Component Value Range   Color, Urine YELLOW  YELLOW   APPearance CLOUDY (*) CLEAR   Specific Gravity, Urine 1.020  1.005 - 1.030   pH 5.5  5.0 - 8.0   Glucose, UA NEGATIVE  NEGATIVE mg/dL   Hgb urine dipstick NEGATIVE  NEGATIVE   Bilirubin Urine NEGATIVE  NEGATIVE   Ketones, ur TRACE (*) NEGATIVE mg/dL   Protein, ur NEGATIVE  NEGATIVE mg/dL   Urobilinogen, UA 1.0  0.0 - 1.0 mg/dL   Nitrite NEGATIVE  NEGATIVE   Leukocytes, UA NEGATIVE  NEGATIVE  PREGNANCY, URINE      Component Value Range   Preg Test, Ur NEGATIVE  NEGATIVE  SALICYLATE LEVEL      Component Value Range   Salicylate Lvl <2.0 (*) 2.8 - 20.0 mg/dL  ACETAMINOPHEN LEVEL      Component Value Range   Acetaminophen (Tylenol), Serum <15.0  10 - 30 ug/mL    0130:  Pt continues to talk and laugh to herself.  2nd ED visit for same.  Unclear mental health hx.  Telepsych eval pending.            Laray Anger, DO 06/20/12 [phone]

## 2012-06-19 NOTE — ED Notes (Signed)
Patient reports smoking marijuana today and says she needs some help.  Patient laughing denying any pain.  Denies any other drug use.

## 2012-06-19 NOTE — ED Notes (Signed)
Tele- psych consult initiated and paperwork faxed. Estimated wait time is approx 3hours.

## 2012-06-19 NOTE — ED Provider Notes (Signed)
  Physical Exam  LMP 06/11/2012  Physical Exam  ED Course  Procedures  Alicia Flores is a 32 y.o. female who presents to the Emergency Department with no stated complaints.  Pt is laughing and is unable to form any full sentences.  Per triage, pt admitted to smoking marijuana earlier today.  Pt is obviously hallucinating and completely unable to answer any questions during exam.  Pt is being sent back for further assessment to evaluate psychosis.      Carlyle Dolly, PA-C 06/19/12 2108

## 2012-06-20 DIAGNOSIS — Z7189 Other specified counseling: Secondary | ICD-10-CM

## 2012-06-20 DIAGNOSIS — F1994 Other psychoactive substance use, unspecified with psychoactive substance-induced mood disorder: Secondary | ICD-10-CM

## 2012-06-20 DIAGNOSIS — F1220 Cannabis dependence, uncomplicated: Secondary | ICD-10-CM

## 2012-06-20 DIAGNOSIS — F1210 Cannabis abuse, uncomplicated: Secondary | ICD-10-CM

## 2012-06-20 MED ORDER — LORAZEPAM 1 MG PO TABS: 1.0000 mg | ORAL_TABLET | Freq: Three times a day (TID) | ORAL | Status: DC | PRN

## 2012-06-20 NOTE — ED Notes (Signed)
Gave water 

## 2012-06-20 NOTE — ED Provider Notes (Signed)
Pt stable awaiting placement  Alicia Lennert, MD 06/20/12 [phone]
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
## 2012-06-20 NOTE — ED Notes (Signed)
One bag in locker 40 

## 2012-06-20 NOTE — [facility] Suicide Risk Assessment (Signed)
Suicide Risk Assessment  Discharge Assessment     Demographic Factors:  Adolescent or young adult, Low socioeconomic status and Living alone  Mental Status Per Nursing Assessment::   On Admission:     Current Mental Status by Physician: Patient denies recent onset ideation intention or plans. Patient has no evidence of psychotic symptoms  Loss Factors: Financial problems/change in socioeconomic status  Historical Factors: Impulsivity  Risk Reduction Factors:   Responsible for children under 63 years of age, Sense of responsibility to family, Religious beliefs about death, Living with another person, especially a relative and Positive social support  Continued Clinical Symptoms:  Depression:   Anhedonia Comorbid alcohol abuse/dependence Insomnia Recent sense of peace/wellbeing Severe Alcohol/Substance Abuse/Dependencies  Cognitive Features That Contribute To Risk:  Polarized thinking    Suicide Risk:  Minimal: No identifiable suicidal ideation.  Patients presenting with no risk factors but with morbid ruminations; may be classified as minimal risk based on the severity of the depressive symptoms  Discharge Diagnoses:   AXIS I:  Substance Induced Mood Disorder and Cannabis abuse AXIS II:  Deferred AXIS III:   Past Medical History  Diagnosis Date  . Anemia   . Sickle cell anemia     sickle cell trait  . Depression   . Anxiety    AXIS IV:  economic problems, occupational problems, other psychosocial or environmental problems and problems related to social environment AXIS V:  41-50 serious symptoms  Plan Of Care/Follow-up recommendations:  Activity:  As tolerated Diet:  Regular  Is patient on multiple antipsychotic therapies at discharge:  No   Has Patient had three or more failed trials of antipsychotic monotherapy by history:  No  Recommended Plan for Multiple Antipsychotic Therapies: Not applicable  Edwing Figley,JANARDHAHA R. 06/20/2012, 12:11 PM

## 2012-06-20 NOTE — ED Provider Notes (Signed)
Medical screening examination/treatment/procedure(s) were performed by non-physician practitioner and as supervising physician I was immediately available for consultation/collaboration. Devoria Albe, MD, Armando Gang   Ward Givens, MD 06/20/12 [phone]

## 2012-06-23 ENCOUNTER — Encounter (HOSPITAL_COMMUNITY): Payer: Self-pay | Admitting: *Deleted

## 2012-06-23 ENCOUNTER — Emergency Department (HOSPITAL_COMMUNITY)
Admission: EM | Admit: 2012-06-23 | Discharge: 2012-06-23 | Disposition: A | Discharge status: Home / Self Care | Payer: Medicaid Other | Attending: Emergency Medicine | Admitting: Emergency Medicine

## 2012-06-23 DIAGNOSIS — R110 Nausea: Secondary | ICD-10-CM | POA: Insufficient documentation

## 2012-06-23 DIAGNOSIS — Z862 Personal history of diseases of the blood and blood-forming organs and certain disorders involving the immune mechanism: Secondary | ICD-10-CM | POA: Insufficient documentation

## 2012-06-23 DIAGNOSIS — Z87891 Personal history of nicotine dependence: Secondary | ICD-10-CM | POA: Insufficient documentation

## 2012-06-23 DIAGNOSIS — Z8659 Personal history of other mental and behavioral disorders: Secondary | ICD-10-CM | POA: Insufficient documentation

## 2012-06-23 DIAGNOSIS — R51 Headache: Secondary | ICD-10-CM | POA: Insufficient documentation

## 2012-06-23 MED ORDER — METOCLOPRAMIDE HCL 10 MG PO TABS
10.0000 mg | ORAL_TABLET | Freq: Once | ORAL | Status: AC
  Administered 2012-06-23: 10 mg via ORAL
  Filled 2012-06-23: qty 1

## 2012-06-23 MED ORDER — KETOROLAC TROMETHAMINE 30 MG/ML IJ SOLN
30.0000 mg | Freq: Once | INTRAMUSCULAR | Status: AC
  Administered 2012-06-23: 30 mg via INTRAMUSCULAR
  Filled 2012-06-23: qty 1

## 2012-06-23 NOTE — ED Notes (Addendum)
Pt states she has head discomfort, "feels like swelling" She is unable to open eyes, states she is very sensitive to light and sound. Denies n/v. States she had difficulty walking earlier today causing her to stubble. She is sitting on the bed with her eyes closed as she is asked questions about her symptoms.

## 2012-06-23 NOTE — ED Notes (Signed)
Per EMS non emergent call was dispatched to patient house. Pt was sitting on the front porch. Per EMS, she was asked cc: states that she had a headache. Pt refused to elaborate on sx's. vulger language expressed and disrespectful to EMT.

## 2012-06-23 NOTE — ED Provider Notes (Signed)
History    32 year old female with headache. Gradual onset about a day ago. Denies trauma. Headache is the frontal region of the face. Feels like pressure and that her head is going to "crack open." Symptoms constant. No appreciable exacerbating relieving factors. No fever or chills. Denies significant past headache history. No sick contacts. No use of blood thinning medication. Mild nausea, but no vomiting. No acute visual complaints. No acute numbness, tingling or loss of strength. No intervention prior to arrival.  CSN: [id number]  Arrival date & time 06/23/12  0350   First MD Initiated Contact with Patient 06/23/12 0413      Chief Complaint  Patient presents with  . Headache    (Consider location/radiation/quality/duration/timing/severity/associated sxs/prior treatment) HPI  Past Medical History  Diagnosis Date  . Anemia   . Sickle cell anemia     sickle cell trait  . Depression   . Anxiety     Past Surgical History  Procedure Laterality Date  . Cesarean section      FTP    Family History  Problem Relation Age of Onset  . Hypertension Mother     History  Substance Use Topics  . Smoking status: Former Smoker -- 0.50 packs/day    Types: Cigarettes    Quit date: 01/11/2011  . Smokeless tobacco: Never Used  . Alcohol Use: No     Comment: social    OB History   Grav Para Term Preterm Abortions TAB SAB Ect Mult Living   3 1 1  1  1   1       Review of Systems  All systems reviewed and negative, other than as noted in HPI.   Allergies  Review of patient's allergies indicates no known allergies.  Home Medications  No current outpatient prescriptions on file.  BP 121/69  Pulse 85  Temp(Src) 98.8 F (37.1 C) (Oral)  Resp 20  SpO2 100%  LMP 06/11/2012  Physical Exam  Nursing note and vitals reviewed. Constitutional: She appears well-developed and well-nourished. No distress.  Laying in bed. No acute distress. Obese.  HENT:  Head: Normocephalic and  atraumatic.  Mouth/Throat: Oropharynx is clear and moist.  Eyes: Conjunctivae and EOM are normal. Pupils are equal, round, and reactive to light. Right eye exhibits no discharge. Left eye exhibits no discharge.  Neck: Neck supple.  No nuchal rigidity  Cardiovascular: Normal rate, regular rhythm and normal heart sounds.  Exam reveals no gallop and no friction rub.   No murmur heard. Pulmonary/Chest: Effort normal and breath sounds normal. No respiratory distress.  Abdominal: Soft. She exhibits no distension. There is no tenderness.  Musculoskeletal: She exhibits no edema and no tenderness.  Neurological: She is alert. No cranial nerve deficit. She exhibits normal muscle tone. Coordination normal.  Good finger to nose testing bilaterally. Gait is steady  Skin: Skin is warm and dry. She is not diaphoretic.  Psychiatric: Her behavior is normal. Thought content normal.    ED Course  Procedures (including critical care time)  Labs Reviewed - No data to display No results found.   1. Headache       MDM  31yF with headache. Suspect primary HA. Consider emergent secondary causes such as bleed, infectious or mass but doubt. There is no history of trauma. Pt has a nonfocal neurological exam. Afebrile and neck supple. No use of blood thinning medication. Consider ocular etiology such as acute angle closure glaucoma but doubt. Pt denies acute change in visual acuity and eye exam unremarkable.  Doubt CO poisoning. No contacts with similar symptoms. Doubt venous thrombosis. Doubt carotid or vertebral arteries dissection. Symptoms improved with meds but still having "pressure." Pt would like a CT scan. No indication for this. Pt requesting transfer to another facility. No need. Pt is being discharged. Return precautions discussed. Outpt fu.         Raeford Razor, MD 06/23/12 [phone]

## 2012-06-23 NOTE — ED Notes (Signed)
[id number]<AV> Expected date:06/23/12<BR> Expected time: 3:37 AM<BR> Means of arrival:Ambulance<BR> Comments:<BR> RM 9: Headache

## 2012-06-29 ENCOUNTER — Encounter (HOSPITAL_COMMUNITY): Payer: Self-pay | Admitting: *Deleted

## 2012-06-29 ENCOUNTER — Emergency Department (HOSPITAL_COMMUNITY)
Admission: EM | Admit: 2012-06-29 | Discharge: 2012-06-29 | Disposition: A | Discharge status: Home / Self Care | Payer: Medicaid Other | Attending: Emergency Medicine | Admitting: Emergency Medicine

## 2012-06-29 DIAGNOSIS — Z862 Personal history of diseases of the blood and blood-forming organs and certain disorders involving the immune mechanism: Secondary | ICD-10-CM | POA: Insufficient documentation

## 2012-06-29 DIAGNOSIS — Z8659 Personal history of other mental and behavioral disorders: Secondary | ICD-10-CM | POA: Insufficient documentation

## 2012-06-29 DIAGNOSIS — M791 Myalgia, unspecified site: Secondary | ICD-10-CM

## 2012-06-29 DIAGNOSIS — Z87891 Personal history of nicotine dependence: Secondary | ICD-10-CM | POA: Insufficient documentation

## 2012-06-29 DIAGNOSIS — IMO0001 Reserved for inherently not codable concepts without codable children: Secondary | ICD-10-CM | POA: Insufficient documentation

## 2012-06-29 LAB — CBC WITH DIFFERENTIAL/PLATELET
Basophils Absolute: 0 10*3/uL (ref 0.0–0.1)
HCT: 34.6 % — ABNORMAL LOW (ref 36.0–46.0)
Lymphocytes Relative: 34 % (ref 12–46)
Monocytes Absolute: 0.4 10*3/uL (ref 0.1–1.0)
Neutro Abs: 3.6 10*3/uL (ref 1.7–7.7)
Platelets: 345 10*3/uL (ref 150–400)
RDW: 14.2 % (ref 11.5–15.5)
WBC: 6.1 10*3/uL (ref 4.0–10.5)

## 2012-06-29 LAB — BASIC METABOLIC PANEL
BUN: 7 mg/dL (ref 6–23)
CO2: 27 mEq/L (ref 19–32)
Chloride: 108 mEq/L (ref 96–112)
Creatinine, Ser: 0.87 mg/dL (ref 0.50–1.10)

## 2012-06-29 MED ORDER — MORPHINE SULFATE 4 MG/ML IJ SOLN
4.0000 mg | Freq: Once | INTRAMUSCULAR | Status: AC
  Administered 2012-06-29: 4 mg via INTRAVENOUS
  Filled 2012-06-29: qty 1

## 2012-06-29 MED ORDER — NAPROXEN 500 MG PO TABS: 500.0000 mg | ORAL_TABLET | Freq: Two times a day (BID) | ORAL | Status: DC

## 2012-06-29 MED ORDER — SODIUM CHLORIDE 0.9 % IV BOLUS (SEPSIS)
1000.0000 mL | Freq: Once | INTRAVENOUS | Status: AC
  Administered 2012-06-29: 1000 mL via INTRAVENOUS

## 2012-06-29 MED ORDER — ONDANSETRON HCL 4 MG/2ML IJ SOLN
4.0000 mg | Freq: Once | INTRAMUSCULAR | Status: AC
  Administered 2012-06-29: 4 mg via INTRAVENOUS
  Filled 2012-06-29: qty 2

## 2012-06-29 NOTE — ED Notes (Signed)
Reports aching all over for a weak. Vomited x2 last time 2 days ago & diarrhea x2 in a week Last time today. General appearance a  Patient in no acute distress. Oral mucosa/skin is dry.

## 2012-06-29 NOTE — ED Notes (Addendum)
Pt in c/o generalized body aches x1 week, pt with history of sickle cell but states she was just diagnosed and is unsure what it feels like or what symptoms she has

## 2012-06-29 NOTE — ED Provider Notes (Signed)
History     CSN: [id number]  Arrival date & time 06/29/12  1624   First MD Initiated Contact with Patient 06/29/12 1800      Chief Complaint  Patient presents with  . Generalized Body Aches    (Consider location/radiation/quality/duration/timing/severity/associated sxs/prior treatment) HPI  33 year old female with history of sickle cell trait, depression, anxiety presents with generalized body aches. Patient reports for the past week she has been having generalized body aches. Describes sensation as an achy sensation, mild to moderate in severity, fluctuating throughout her body. She is unsure if this is related to the sickle cell, since she has never had "sickle cell crisis" before. She denies fever, chills, headache, sneezing, cough, sore throat, chest pain, shortness of breath, nausea, vomiting, diarrhea, abdominal pain, dysuria, or rash. Nothing seems to make pain better or worse. She has not tried any specific treatment. Her last menstrual period was January 29. There has been no sick contact.  Past Medical History  Diagnosis Date  . Anemia   . Sickle cell anemia     sickle cell trait  . Depression   . Anxiety     Past Surgical History  Procedure Laterality Date  . Cesarean section      FTP    Family History  Problem Relation Age of Onset  . Hypertension Mother     History  Substance Use Topics  . Smoking status: Former Smoker -- 0.50 packs/day    Types: Cigarettes    Quit date: 01/11/2011  . Smokeless tobacco: Never Used  . Alcohol Use: No     Comment: social    OB History   Grav Para Term Preterm Abortions TAB SAB Ect Mult Living   3 1 1  1  1   1       Review of Systems  Constitutional:       10 Systems reviewed and all are negative for acute change except as noted in the HPI.     Allergies  Review of patient's allergies indicates no known allergies.  Home Medications  No current outpatient prescriptions on file.  BP 125/74  Pulse 78   Temp(Src) 98.6 F (37 C) (Oral)  SpO2 100%  LMP 06/11/2012  Physical Exam  Nursing note and vitals reviewed. Constitutional: She is oriented to person, place, and time. She appears well-developed and well-nourished. No distress.  Awake, alert, nontoxic appearance  HENT:  Head: Atraumatic.  Eyes: Conjunctivae are normal. Right eye exhibits no discharge. Left eye exhibits no discharge.  Neck: Neck supple.  Cardiovascular: Normal rate and regular rhythm.  Exam reveals no gallop and no friction rub.   No murmur heard. Pulmonary/Chest: Effort normal. No respiratory distress. She exhibits no tenderness.  Abdominal: Soft. There is no tenderness. There is no rebound.  Musculoskeletal: She exhibits no edema and no tenderness.  ROM appears intact, no obvious focal weakness  5/5 strength to all four extremities  Neurological: She is alert and oriented to person, place, and time. She has normal reflexes.  Mental status and motor strength appears intact  Skin: No rash noted.  Patient complaining of pain to the back even on slight palpation. Pain is diffuse with no focal point tenderness. There is no overlying skin changes. There is no significant midline spine tenderness.  Psychiatric: She has a normal mood and affect.    ED Course  Procedures (including critical care time)  Results for orders placed during the hospital encounter of 06/29/12  CBC WITH DIFFERENTIAL  Result Value Range   WBC 6.1  4.0 - 10.5 K/uL   RBC 4.23  3.87 - 5.11 MIL/uL   Hemoglobin 11.8 (*) 12.0 - 15.0 g/dL   HCT 16.1 (*) 09.6 - 04.5 %   MCV 81.8  78.0 - 100.0 fL   MCH 27.9  26.0 - 34.0 pg   MCHC 34.1  30.0 - 36.0 g/dL   RDW 40.9  81.1 - 91.4 %   Platelets 345  150 - 400 K/uL   Neutrophils Relative 59  43 - 77 %   Neutro Abs 3.6  1.7 - 7.7 K/uL   Lymphocytes Relative 34  12 - 46 %   Lymphs Abs 2.1  0.7 - 4.0 K/uL   Monocytes Relative 7  3 - 12 %   Monocytes Absolute 0.4  0.1 - 1.0 K/uL   Eosinophils  Relative 0  0 - 5 %   Eosinophils Absolute 0.0  0.0 - 0.7 K/uL   Basophils Relative 1  0 - 1 %   Basophils Absolute 0.0  0.0 - 0.1 K/uL  BASIC METABOLIC PANEL      Result Value Range   Sodium 141  135 - 145 mEq/L   Potassium 3.6  3.5 - 5.1 mEq/L   Chloride 108  96 - 112 mEq/L   CO2 27  19 - 32 mEq/L   Glucose, Bld 97  70 - 99 mg/dL   BUN 7  6 - 23 mg/dL   Creatinine, Ser 7.82  0.50 - 1.10 mg/dL   Calcium 8.5  8.4 - 95.6 mg/dL   GFR calc non Af Amer 89 (*) >90 mL/min   GFR calc Af Amer >90  >90 mL/min  RETICULOCYTES      Result Value Range   Retic Ct Pct 1.2  0.4 - 3.1 %   RBC. 4.23  3.87 - 5.11 MIL/uL   Retic Count, Manual 50.8  19.0 - 186.0 K/uL   Ct Head Wo Contrast  06/17/2012  *RADIOLOGY REPORT*  Clinical Data:  Altered mental status  CT HEAD WITHOUT CONTRAST CT CERVICAL SPINE WITHOUT CONTRAST  Technique:  Multidetector CT imaging of the head and cervical spine was performed following the standard protocol without intravenous contrast.  Multiplanar CT image reconstructions of the cervical spine were also generated.  Comparison:   None  CT HEAD  Findings: No mass effect, midline shift, or acute intracranial hemorrhage.  Brain parenchyma and ventricles system are within normal limits.  Mastoid air cells are clear.  Cranium is intact.  IMPRESSION: Negative head CT.  CT CERVICAL SPINE  Findings: No acute fracture.  No dislocation.  Reversal of the normally lordotic cervical spine.  No obvious soft tissue injury. Unremarkable thyroid gland.  IMPRESSION: No acute bony injury in the cervical spine.   Original Report Authenticated By: Jolaine Click, M.D.    Ct Cervical Spine Wo Contrast  06/17/2012  *RADIOLOGY REPORT*  Clinical Data:  Altered mental status  CT HEAD WITHOUT CONTRAST CT CERVICAL SPINE WITHOUT CONTRAST  Technique:  Multidetector CT imaging of the head and cervical spine was performed following the standard protocol without intravenous contrast.  Multiplanar CT image reconstructions of  the cervical spine were also generated.  Comparison:   None  CT HEAD  Findings: No mass effect, midline shift, or acute intracranial hemorrhage.  Brain parenchyma and ventricles system are within normal limits.  Mastoid air cells are clear.  Cranium is intact.  IMPRESSION: Negative head CT.  CT CERVICAL SPINE  Findings:  No acute fracture.  No dislocation.  Reversal of the normally lordotic cervical spine.  No obvious soft tissue injury. Unremarkable thyroid gland.  IMPRESSION: No acute bony injury in the cervical spine.   Original Report Authenticated By: Jolaine Click, M.D.      6:34 PM Patient was seen and evaluate need for her generalized body aches. She doesn't complain of any URI symptoms. She denies taking any medication concerning for rhabdomyolysis. She reports having history of sickle cell anemia however I saw no evidence of that. She has no symptoms concerning for acute chest syndrome. Will obtain basic labs, and reticulocyte count, will give IVF fluid, pain medication.  7:49 PM  hemoglobin of 11.8, no significant evidence of sickle cell crisis. Normal white count, normal electrolytes .  Will discharge pt with f/u instruction.  Return precaution discussed.  I do not suspect pt is having sickle cell related pain nor is she having any significant finding to suggest infection.    BP 107/71  Pulse 78  Temp(Src) 98.6 F (37 C) (Oral)  SpO2 100%  LMP 06/11/2012  I have reviewed nursing notes and vital signs.   I reviewed available ER/hospitalization records thought the EMR  1. Body aches  MDM          Fayrene Helper, PA-C 06/29/12 1951  Fayrene Helper, PA-C 06/29/12 [phone]

## 2012-06-29 NOTE — ED Provider Notes (Signed)
Medical screening examination/treatment/procedure(s) were performed by non-physician practitioner and as supervising physician I was immediately available for consultation/collaboration.  Raeford Razor, MD 06/29/12 2328

## 2012-06-29 NOTE — ED Notes (Signed)
MD at bedside. 

## 2012-10-01 ENCOUNTER — Encounter (HOSPITAL_COMMUNITY): Payer: Self-pay | Admitting: Emergency Medicine

## 2012-10-01 ENCOUNTER — Emergency Department (HOSPITAL_COMMUNITY)
Admission: EM | Admit: 2012-10-01 | Discharge: 2012-10-01 | Disposition: A | Discharge status: Home / Self Care | Payer: Medicaid Other | Attending: Emergency Medicine | Admitting: Emergency Medicine

## 2012-10-01 DIAGNOSIS — N760 Acute vaginitis: Secondary | ICD-10-CM | POA: Insufficient documentation

## 2012-10-01 DIAGNOSIS — N898 Other specified noninflammatory disorders of vagina: Secondary | ICD-10-CM | POA: Insufficient documentation

## 2012-10-01 DIAGNOSIS — Z862 Personal history of diseases of the blood and blood-forming organs and certain disorders involving the immune mechanism: Secondary | ICD-10-CM | POA: Insufficient documentation

## 2012-10-01 DIAGNOSIS — Z3202 Encounter for pregnancy test, result negative: Secondary | ICD-10-CM | POA: Insufficient documentation

## 2012-10-01 DIAGNOSIS — Z87891 Personal history of nicotine dependence: Secondary | ICD-10-CM | POA: Insufficient documentation

## 2012-10-01 DIAGNOSIS — R102 Pelvic and perineal pain: Secondary | ICD-10-CM

## 2012-10-01 DIAGNOSIS — R51 Headache: Secondary | ICD-10-CM | POA: Insufficient documentation

## 2012-10-01 DIAGNOSIS — R110 Nausea: Secondary | ICD-10-CM | POA: Insufficient documentation

## 2012-10-01 DIAGNOSIS — N949 Unspecified condition associated with female genital organs and menstrual cycle: Secondary | ICD-10-CM | POA: Insufficient documentation

## 2012-10-01 DIAGNOSIS — B9689 Other specified bacterial agents as the cause of diseases classified elsewhere: Secondary | ICD-10-CM

## 2012-10-01 DIAGNOSIS — Z8659 Personal history of other mental and behavioral disorders: Secondary | ICD-10-CM | POA: Insufficient documentation

## 2012-10-01 LAB — WET PREP, GENITAL
Trich, Wet Prep: NONE SEEN
Yeast Wet Prep HPF POC: NONE SEEN

## 2012-10-01 LAB — POCT PREGNANCY, URINE: Preg Test, Ur: NEGATIVE

## 2012-10-01 MED ORDER — METRONIDAZOLE 500 MG PO TABS: 500.0000 mg | ORAL_TABLET | Freq: Two times a day (BID) | ORAL | Status: DC

## 2012-10-01 MED ORDER — ACETAMINOPHEN 500 MG PO TABS
1000.0000 mg | ORAL_TABLET | Freq: Once | ORAL | Status: AC
  Administered 2012-10-01: 1000 mg via ORAL
  Filled 2012-10-01: qty 2
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
## 2012-10-01 NOTE — ED Notes (Signed)
Pt states she has not taken a home pregnancy test but has been feeling pregnant. Pt states she has been feeling dizzy, and nauseous lately so she thought she may be pregnant. Pt states she is in no pain at this time. Pt takes clonopin for anxiety and depression she last took her medication yesterday. Pt not exactly sure when her LMP was due to them being irregular but believes it was April 8th.

## 2012-10-01 NOTE — ED Notes (Signed)
Discharge instructions reviewed with pt. Pt verbalized understanding.   
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
## 2013-04-25 IMAGING — CT CT HEAD W/O CM
4 series · 16 of 30 positions shown, 19 images · non-contrast
Comparison: None

CT HEAD

CLINICAL DATA: Altered mental status

CT HEAD WITHOUT CONTRAST
CT CERVICAL SPINE WITHOUT CONTRAST
TECHNIQUE: Multidetector CT imaging of the head and cervical spine
was performed following the standard protocol without intravenous
contrast.  Multiplanar CT image reconstructions of the cervical
spine were also generated.

[Series 2: head w/o · axial · non-contrast · 0.43mm/px · z∈[+1110,+1155]mm · 2 of 27 slices shown]
[im 9/27  brain]
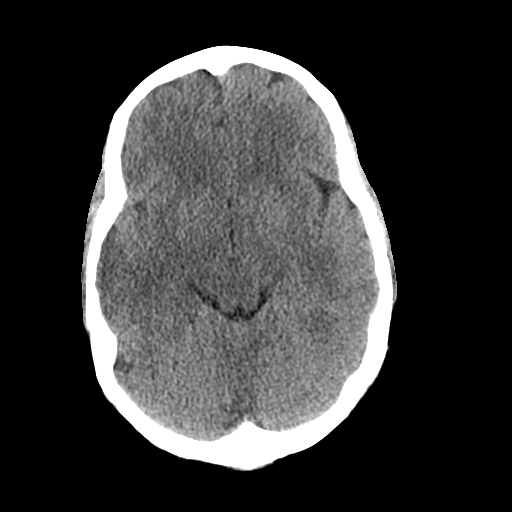
[im 18/27  brain]
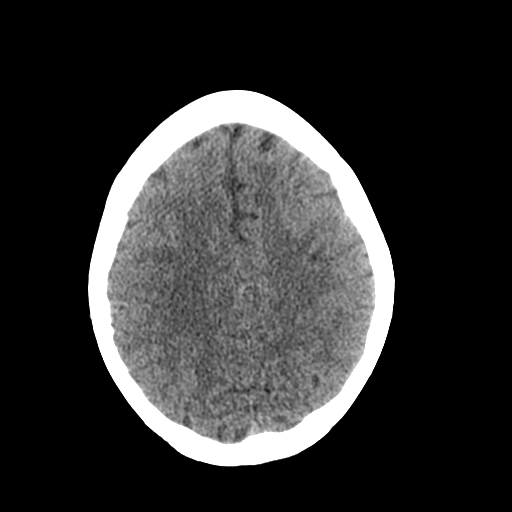

[Series 3: bone windows · axial · 0.43mm/px · z∈[+1110,+1155]mm · 2 of 27 slices shown]
[im 9/27  bone]
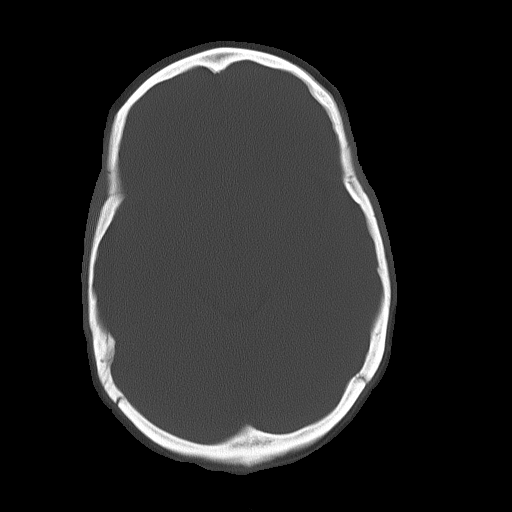
[im 18/27  bone]
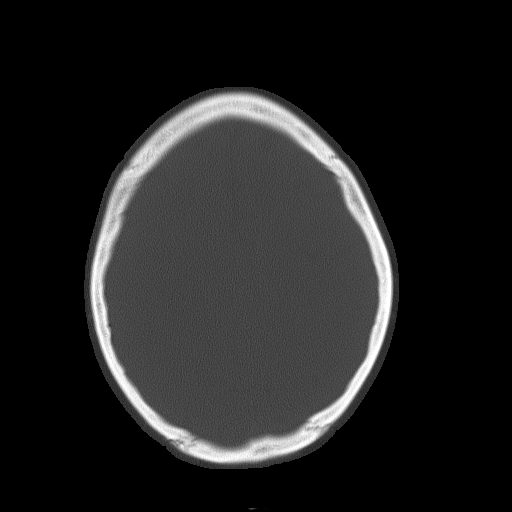

[Series 4: c-spine st · axial · 0.29mm/px · z∈[+932,+964]mm · 3 of 79 slices shown]
[im 8/79  brain]
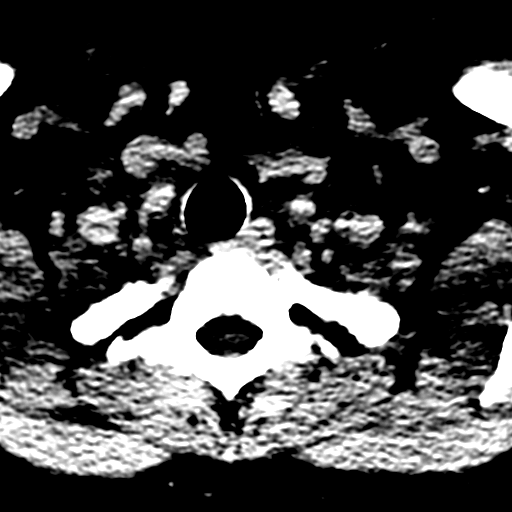
[im 16/79  brain]
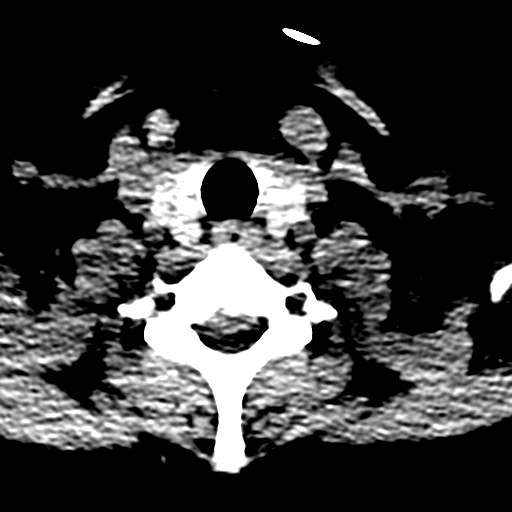
[im 24/79  brain]
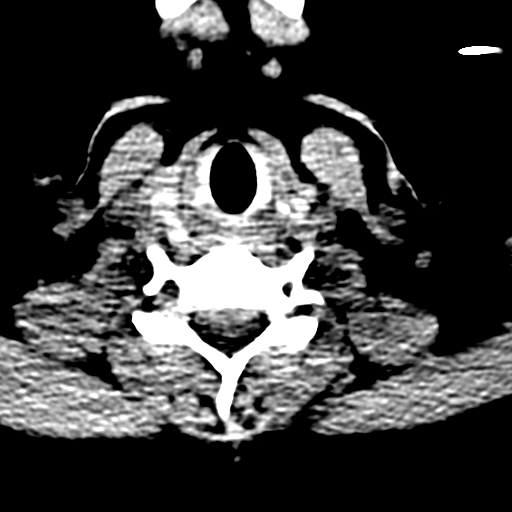

[Series 9: axial recon · axial · 0.23mm/px · z∈[+923,+1047]mm · 9 of 79 slices shown, 12 images]
[im 8/79  brain]
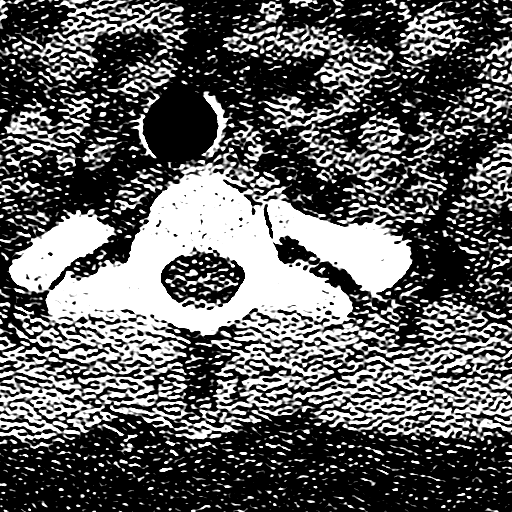
[im 8/79  bone]
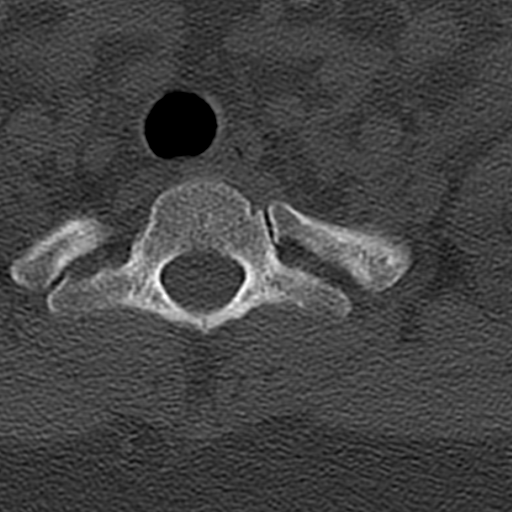
[im 16/79  brain]
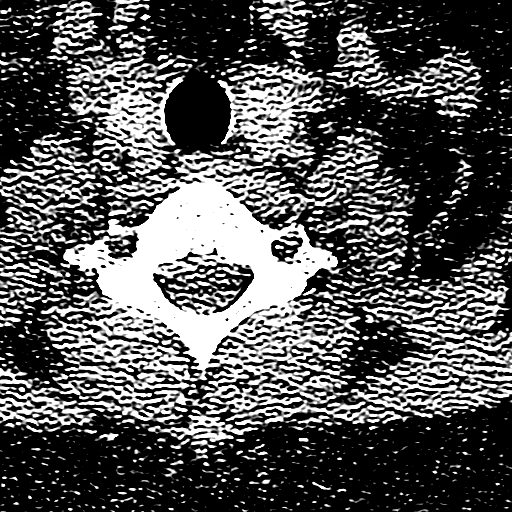
[im 24/79  brain]
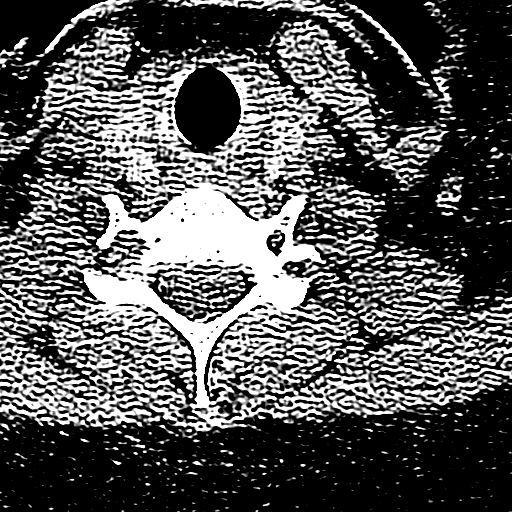
[im 32/79  brain]
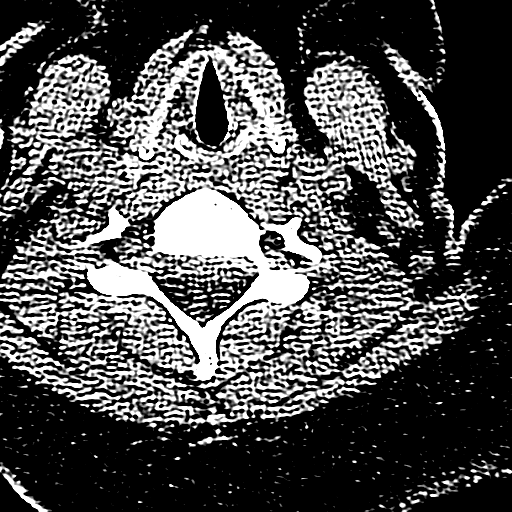
[im 40/79  brain]
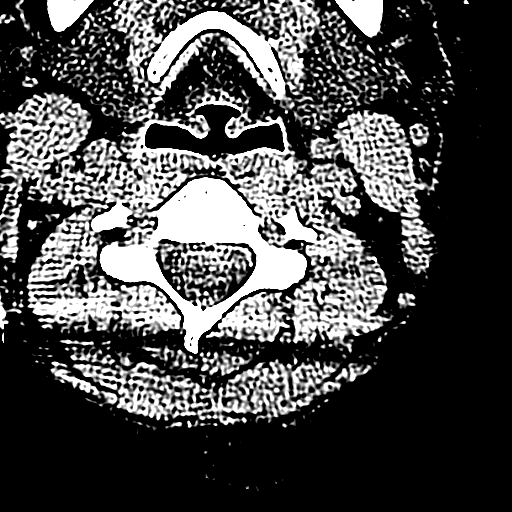
[im 40/79  bone]
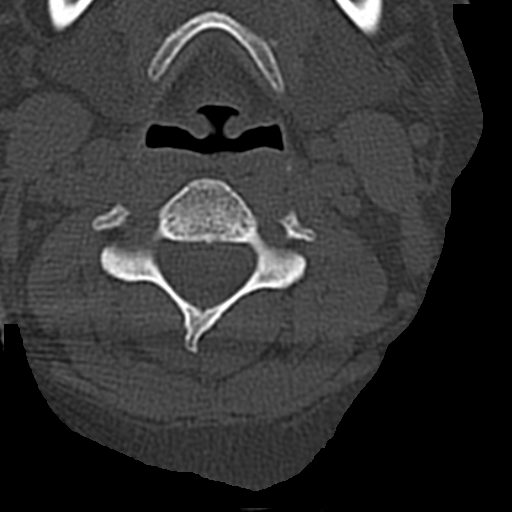
[im 47/79  brain]
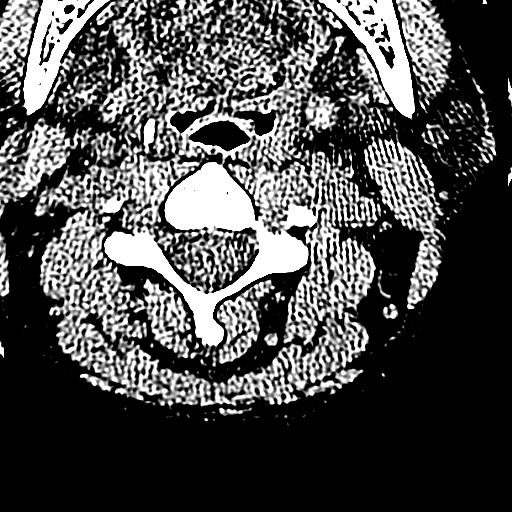
[im 55/79  brain]
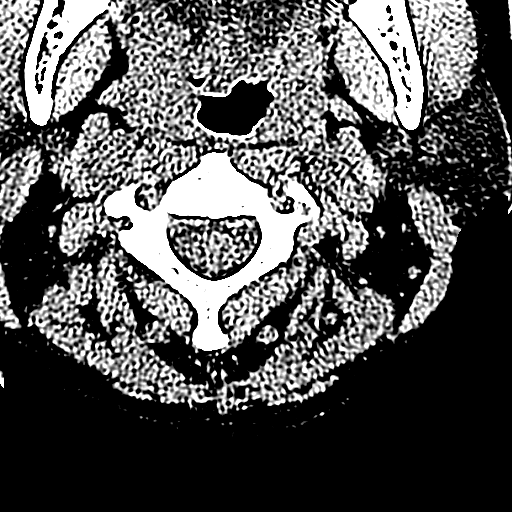
[im 63/79  brain]
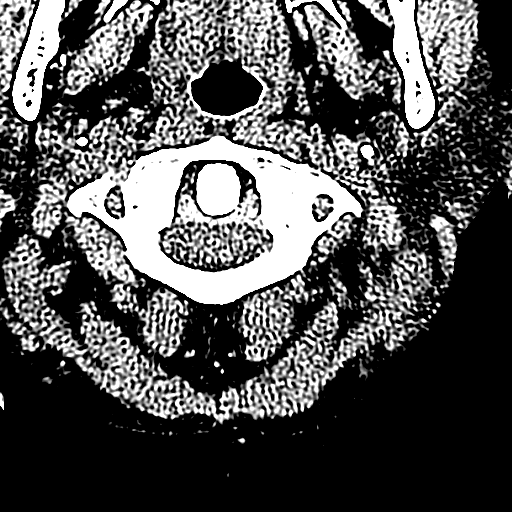
[im 71/79  brain]
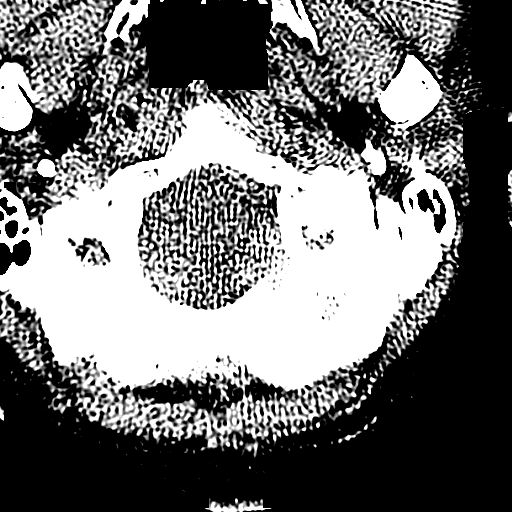
[im 71/79  bone]
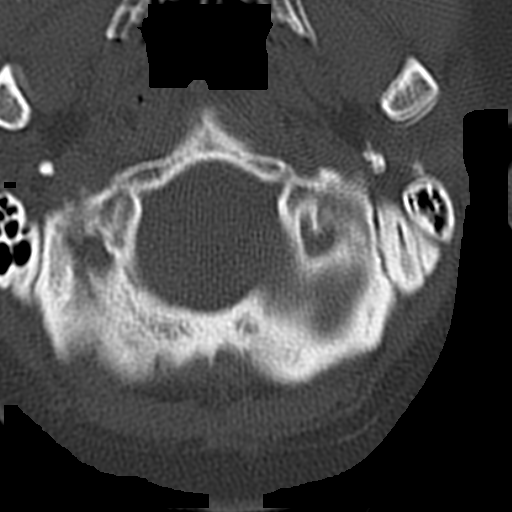

[16 of 30 positions shown; findings below may reference images not displayed]

FINDINGS: No mass effect, midline shift, or acute intracranial
hemorrhage.  Brain parenchyma and ventricles system are within
normal limits.  Mastoid air cells are clear.  Cranium is intact.
IMPRESSION: Negative head CT.

CT CERVICAL SPINE
FINDINGS: No acute fracture.  No dislocation.  Reversal of the
normally lordotic cervical spine.  No obvious soft tissue injury.
Unremarkable thyroid gland.
IMPRESSION: No acute bony injury in the cervical spine.

## 2014-03-15 ENCOUNTER — Encounter (HOSPITAL_COMMUNITY): Payer: Self-pay | Admitting: Emergency Medicine

## 2014-07-16 ENCOUNTER — Emergency Department (HOSPITAL_COMMUNITY)
Admission: EM | Admit: 2014-07-16 | Discharge: 2014-07-16 | Disposition: A | Discharge status: Home / Self Care | Payer: Medicare Other | Attending: Emergency Medicine | Admitting: Emergency Medicine

## 2014-07-16 ENCOUNTER — Encounter (HOSPITAL_COMMUNITY): Payer: Self-pay

## 2014-07-16 DIAGNOSIS — Z79899 Other long term (current) drug therapy: Secondary | ICD-10-CM | POA: Insufficient documentation

## 2014-07-16 DIAGNOSIS — N921 Excessive and frequent menstruation with irregular cycle: Secondary | ICD-10-CM | POA: Diagnosis not present

## 2014-07-16 DIAGNOSIS — F419 Anxiety disorder, unspecified: Secondary | ICD-10-CM | POA: Diagnosis not present

## 2014-07-16 DIAGNOSIS — Z862 Personal history of diseases of the blood and blood-forming organs and certain disorders involving the immune mechanism: Secondary | ICD-10-CM | POA: Insufficient documentation

## 2014-07-16 DIAGNOSIS — F329 Major depressive disorder, single episode, unspecified: Secondary | ICD-10-CM | POA: Diagnosis not present

## 2014-07-16 DIAGNOSIS — M79622 Pain in left upper arm: Secondary | ICD-10-CM

## 2014-07-16 DIAGNOSIS — Z87891 Personal history of nicotine dependence: Secondary | ICD-10-CM | POA: Diagnosis not present

## 2014-07-16 DIAGNOSIS — R635 Abnormal weight gain: Secondary | ICD-10-CM | POA: Insufficient documentation

## 2014-07-16 DIAGNOSIS — Z3202 Encounter for pregnancy test, result negative: Secondary | ICD-10-CM | POA: Insufficient documentation

## 2014-07-16 DIAGNOSIS — N898 Other specified noninflammatory disorders of vagina: Secondary | ICD-10-CM | POA: Diagnosis not present

## 2014-07-16 DIAGNOSIS — R42 Dizziness and giddiness: Secondary | ICD-10-CM | POA: Diagnosis present

## 2014-07-16 LAB — I-STAT CHEM 8, ED
BUN: 17 mg/dL (ref 6–23)
Calcium, Ion: 1.12 mmol/L (ref 1.12–1.23)
Chloride: 106 mmol/L (ref 96–112)
Creatinine, Ser: 1.2 mg/dL — ABNORMAL HIGH (ref 0.50–1.10)
Glucose, Bld: 92 mg/dL (ref 70–99)
HCT: 42 % (ref 36.0–46.0)
Hemoglobin: 14.3 g/dL (ref 12.0–15.0)
Potassium: 3.9 mmol/L (ref 3.5–5.1)
Sodium: 143 mmol/L (ref 135–145)
TCO2: 22 mmol/L (ref 0–100)

## 2014-07-16 LAB — CBC WITH DIFFERENTIAL/PLATELET
Basophils Absolute: 0 10*3/uL (ref 0.0–0.1)
Basophils Relative: 0 % (ref 0–1)
Eosinophils Absolute: 0.1 10*3/uL (ref 0.0–0.7)
Eosinophils Relative: 2 % (ref 0–5)
HCT: 38.3 % (ref 36.0–46.0)
Hemoglobin: 13 g/dL (ref 12.0–15.0)
Lymphocytes Relative: 34 % (ref 12–46)
Lymphs Abs: 1.9 10*3/uL (ref 0.7–4.0)
MCH: 28.8 pg (ref 26.0–34.0)
MCHC: 33.9 g/dL (ref 30.0–36.0)
MCV: 84.7 fL (ref 78.0–100.0)
Monocytes Absolute: 0.5 10*3/uL (ref 0.1–1.0)
Monocytes Relative: 9 % (ref 3–12)
Neutro Abs: 3 10*3/uL (ref 1.7–7.7)
Neutrophils Relative %: 55 % (ref 43–77)
Platelets: 324 10*3/uL (ref 150–400)
RBC: 4.52 MIL/uL (ref 3.87–5.11)
RDW: 14.4 % (ref 11.5–15.5)
WBC: 5.4 10*3/uL (ref 4.0–10.5)

## 2014-07-16 LAB — WET PREP, GENITAL
Trich, Wet Prep: NONE SEEN
WBC, Wet Prep HPF POC: NONE SEEN
Yeast Wet Prep HPF POC: NONE SEEN

## 2014-07-16 LAB — I-STAT BETA HCG BLOOD, ED (MC, WL, AP ONLY): I-stat hCG, quantitative: <5 m[IU]/mL (ref <5)

## 2014-07-16 NOTE — ED Provider Notes (Signed)
Patient complains of "boil" at left axilla for one year. She states it drained blood one week ago. No drainage since. No pain at site. She states it itches somewhat she also complains of heavy menses. Her last menstrual period was 06/27/2014. She is also concerned about weight gain over the past 6 months She states "I've been having miscarriages" however she reports that her pregnancy test was negative. On exam patient is alert nontoxic. No distress. HEENT exam no facial asymmetry left axilla no fluctuance no swelling no tenderness. No drainage. Abdomen morbidly obese, nontender  Doug SouSam Mohanad Carsten, MD 07/16/14 [phone]

## 2014-07-16 NOTE — ED Notes (Addendum)
Patient reports that she has had intermittent dizziness x 6 months. Patient denies any nausea or blurred vision. Patient also c/o left axilla abscess that she has had before. Patient also states she saw her PCP because she was missing her period and found out she had a miscarriage 2-3 weeks ago. Patient states she was told she needed an Ultrasound of her abdomen, but had personal issues and could not come.

## 2014-07-16 NOTE — ED Provider Notes (Signed)
CSN: [id number]     Arrival date & time 07/16/14  4098 History   First MD Initiated Contact with Patient 07/16/14 0745     Chief Complaint  Patient presents with  . Dizziness  . Abscess   Filed Vitals:   07/16/14 1102  BP: 125/98  Pulse: 81  Temp: 97.8 F (36.6 C)  Resp: 18    (Consider location/radiation/quality/duration/timing/severity/associated sxs/prior Treatment) HPI Pt is a 34yo female with hx of sickle cell trait, anxiety, and depression, presenting to ED with multiple complaints.  Pt states she initially came to ED for evaluation of recurrent left axilla abscess. Pt states she has had the area cut open several times but it keeps coming back. States the area has been sore for the last 2-3 months, becomes more raw when she uses Deoderant so she has stopped using it. Pt also concerned due to intermittent dizziness for 63mo.  Pt states she saw her PCP because she was missing her period, but LMP was 06/14/14.  Her menstrual cycle has been heavy.  Her PCP told her she was having a miscarriage 2-3 weeks ago.  Pt cannot recall the name of the clinic she goes to or doctor she see but states he started her on a medication so she is concerned it is causing her to gain weight and produce breast milk but denies nipple discharge or breast pain.  Pt was advised by her PCP to get an abdominal ultrasound but states due to personal issues she could not make her appointment.  Her PCP directed her to ED for further workup.  Pt states she did take a home pregnancy test which was negative and never did have a positive pregnancy test. Denies abdominal pain at this time. Denies n/v/d. Denies chest pain or SOB. No fever or chills.    Pt is not currently on birth control as she states it caused her to gain weight last time she was on it. Pt is aware of risk of pregnancy not being on birth control.   Past Medical History  Diagnosis Date  . Anemia   . Sickle cell anemia     sickle cell trait  . Depression   .  Anxiety    Past Surgical History  Procedure Laterality Date  . Cesarean section      FTP  . Foot surgery     Family History  Problem Relation Age of Onset  . Hypertension Mother    History  Substance Use Topics  . Smoking status: Former Smoker -- 0.50 packs/day    Types: Cigarettes    Quit date: 01/11/2011  . Smokeless tobacco: Never Used  . Alcohol Use: No     Comment: social   OB History    Gravida Para Term Preterm AB TAB SAB Ectopic Multiple Living   Review of Systems  Constitutional: Positive for unexpected weight change (weight gain). Negative for fever, chills and fatigue.  Respiratory: Negative for cough and shortness of breath.   Cardiovascular: Negative for chest pain and palpitations.  Gastrointestinal: Negative for nausea, vomiting, abdominal pain and diarrhea.  Genitourinary: Positive for vaginal bleeding and menstrual problem ( heavy cycle). Negative for dysuria, urgency, frequency, flank pain, decreased urine volume, vaginal discharge, vaginal pain and pelvic pain.  Skin: Positive for wound ( abscess, left axilla ). Negative for color change.  Neurological: Positive for dizziness and light-headedness. Negative for tremors, syncope, weakness, numbness and  headaches.  All other systems reviewed and are negative.     Allergies  Risperidone and related  Home Medications   Prior to Admission medications   Medication Sig Start Date End Date Taking? Authorizing Provider  benztropine (COGENTIN) 0.5 MG tablet Take 0.5 mg by mouth at bedtime.   Yes Historical Provider, MD  citalopram (CELEXA) 40 MG tablet Take 40 mg by mouth at bedtime.   Yes Historical Provider, MD  OVER THE COUNTER MEDICATION Take 2 capsules by mouth 2 (two) times daily. *Hydroxycut*   Yes Historical Provider, MD  risperiDONE (RISPERDAL) 3 MG tablet Take 3 mg by mouth at bedtime.   Yes Historical Provider, MD  metroNIDAZOLE (FLAGYL) 500 MG tablet Take 1 tablet (500 mg  total) by mouth 2 (two) times daily. Patient not taking: Reported on 07/16/2014 10/01/12   Ward GivensIva L Knapp, MD   BP 125/98 mmHg  Pulse 81  Temp(Src) 97.8 F (36.6 C) (Oral)  Resp 18  SpO2 99%  LMP 06/14/2014 Physical Exam  Constitutional: She is oriented to person, place, and time. She appears well-developed and well-nourished. No distress.  Obese female lying in exam bed, NAD  HENT:  Head: Normocephalic and atraumatic.  Eyes: Conjunctivae and EOM are normal. Pupils are equal, round, and reactive to light. No scleral icterus.  Neck: Normal range of motion. Neck supple.  Cardiovascular: Normal rate, regular rhythm and normal heart sounds.   Pulmonary/Chest: Effort normal and breath sounds normal. No respiratory distress. She has no wheezes. She has no rales. She exhibits no tenderness.  No respiratory distress, able to speak in full sentences w/o difficulty. Lungs: CTAB  Abdominal: Soft. Bowel sounds are normal. She exhibits no distension and no mass. There is no tenderness. There is no rebound and no guarding.  Obese abdomen, soft, non-tender. No CVAT  Genitourinary: Vaginal discharge found.  Chaperoned exam. Normal external genitalia. No vaginal bleeding. Vaginal canal-small amount white-clear malodorous discharge. Cervical os-closed. No CMT, adnexal tenderness or masses palpated  Musculoskeletal: Normal range of motion.  Neurological: She is alert and oriented to person, place, and time. She has normal strength. No cranial nerve deficit or sensory deficit. Coordination and gait normal. GCS eye subscore is 4. GCS verbal subscore is 5. GCS motor subscore is 6.  CN II-XII in tact. Speech is fluent. Alert to person, place and time. FROM all extremities with 5/5 strength. Sensation in tact. Normal gait.   Skin: Skin is warm and dry. She is not diaphoretic.  Left axilla: 1cm area of scar tissue with two pin point openings, small amount of white-yellow discharge. Mild tenderness. No bleeding.  No  erythema, warmth or red streaking.  Nursing note and vitals reviewed.   ED Course  Procedures (including critical care time) Labs Review Labs Reviewed  WET PREP, GENITAL - Abnormal; Notable for the following:    Clue Cells Wet Prep HPF POC FEW (*)    All other components within normal limits  I-STAT CHEM 8, ED - Abnormal; Notable for the following:    Creatinine, Ser 1.20 (*)    All other components within normal limits  CBC WITH DIFFERENTIAL/PLATELET  HIV ANTIBODY (ROUTINE TESTING)  RPR  I-STAT BETA HCG BLOOD, ED ([facility], [facility], [facility] ONLY)  GC/CHLAMYDIA PROBE AMP ([facility])    Imaging Review No results found.   EKG Interpretation   Date/Time:  Friday July 16 2014 07:51:52 EST Ventricular Rate:  84 PR Interval:  133 QRS Duration: 89 QT Interval:  398 QTC Calculation: 470  R Axis:   66 Text Interpretation:  Sinus rhythm Since last tracing rate slower  Confirmed by JACUBOWITZ  MD, SAM [id number]) on 07/16/2014 7:56:06 AM      MDM   Final diagnoses:  Left axillary pain  Weight gain  Menorrhagia with irregular cycle    Pt presenting to ED with multiple complaints including recurrent abscess to left axilla.  On exam, scar tissue present with scant amount of white-yellow discharge. Mild tenderness. No erythema. No fluctuance. Due to spontaneous drainage w/o fluctuance I&D not indicated at this time. Will encouraged pt to use warm compresses. Will also refer to general surgery as pt states abscess is recurrent. Pt also reports concern for recent miscarriage and heavy menstrual cycle.  Pt denies ever having a positive pregnancy test but states she was concerned of pregnancy due to weight gain and missing her cycle in January so she just assumed she was having a miscarriage.  Beta hCG: <5.0 CBC and Chem-8: unremarkable.  Pelvic exam: cervical os closed, no vaginal bleeding. No CMT or adnexal tenderness.  Pelvic U/S not indicated at this time. Doubt ectopic pregnancy, TOA, or ovarian  torsion. GC/Chlamydia, HIV and RPR pending.   Wet prep: unremarkable. Filed Vitals:   07/16/14 1102  BP: 125/98  Pulse: 81  Temp: 97.8 F (36.6 C)  Resp: 18   Pt stable for discharge home. Advised to f/u with her PCP and encouraged to establish care with an OB/GYN at [facility] outpatient clinic for further evaluation and treatment of irregular heavy menstrual cycles.  Return precautions provided. Pt verbalized understanding and agreement with tx plan.       Junius Finner, PA-C 07/16/14 1708  Doug Sou, MD 07/19/14 [phone]

## 2014-07-17 LAB — RPR: RPR Ser Ql: NONREACTIVE

## 2014-07-17 LAB — HIV ANTIBODY (ROUTINE TESTING W REFLEX): HIV Screen 4th Generation wRfx: NONREACTIVE

## 2014-07-19 LAB — GC/CHLAMYDIA PROBE AMP (~~LOC~~) NOT AT ARMC
Chlamydia: NEGATIVE
Neisseria Gonorrhea: NEGATIVE

## 2014-08-21 ENCOUNTER — Emergency Department (HOSPITAL_COMMUNITY)
Admission: EM | Admit: 2014-08-21 | Discharge: 2014-08-21 | Disposition: A | Discharge status: Home / Self Care | Payer: Medicare Other | Attending: Emergency Medicine | Admitting: Emergency Medicine

## 2014-08-21 ENCOUNTER — Encounter (HOSPITAL_COMMUNITY): Payer: Self-pay | Admitting: *Deleted

## 2014-08-21 DIAGNOSIS — Z862 Personal history of diseases of the blood and blood-forming organs and certain disorders involving the immune mechanism: Secondary | ICD-10-CM | POA: Diagnosis not present

## 2014-08-21 DIAGNOSIS — K625 Hemorrhage of anus and rectum: Secondary | ICD-10-CM | POA: Diagnosis present

## 2014-08-21 DIAGNOSIS — K602 Anal fissure, unspecified: Secondary | ICD-10-CM

## 2014-08-21 DIAGNOSIS — F329 Major depressive disorder, single episode, unspecified: Secondary | ICD-10-CM | POA: Insufficient documentation

## 2014-08-21 DIAGNOSIS — F419 Anxiety disorder, unspecified: Secondary | ICD-10-CM | POA: Diagnosis not present

## 2014-08-21 DIAGNOSIS — Z720 Tobacco use: Secondary | ICD-10-CM | POA: Diagnosis not present

## 2014-08-21 MED ORDER — POLYETHYLENE GLYCOL 3350 17 G PO PACK: 17.0000 g | PACK | Freq: Two times a day (BID) | ORAL | Status: AC

## 2014-08-21 MED ORDER — DOCUSATE SODIUM 100 MG PO CAPS: 100.0000 mg | ORAL_CAPSULE | Freq: Two times a day (BID) | ORAL | Status: DC | PRN

## 2014-08-21 NOTE — ED Provider Notes (Signed)
CSN: [id number]     Arrival date & time 08/21/14  1051 History   First MD Initiated Contact with Patient 08/21/14 1119     Chief Complaint  Patient presents with  . Rectal Bleeding     (Consider location/radiation/quality/duration/timing/severity/associated sxs/prior Treatment) Patient is a 34 y.o. female presenting with hematochezia.  Rectal Bleeding Quality:  Bright red Amount:  Scant Duration:  2 days Timing:  Constant Progression:  Worsening Chronicity:  New Context: constipation   Relieved by:  Nothing Worsened by:  Defecation Associated symptoms: no abdominal pain, no fever, no light-headedness, no loss of consciousness and no vomiting   Associated symptoms comment:  Sharp pain with defecation   Past Medical History  Diagnosis Date  . Anemia   . Sickle cell anemia     sickle cell trait  . Depression   . Anxiety    Past Surgical History  Procedure Laterality Date  . Cesarean section      FTP  . Foot surgery     Family History  Problem Relation Age of Onset  . Hypertension Mother    History  Substance Use Topics  . Smoking status: Current Some Day Smoker -- 0.50 packs/day    Types: Cigarettes    Last Attempt to Quit: 01/11/2011  . Smokeless tobacco: Never Used  . Alcohol Use: No     Comment: social   OB History    Gravida Para Term Preterm AB TAB SAB Ectopic Multiple Living   3 1 1  1  1   1      Review of Systems  Constitutional: Negative for fever.  Gastrointestinal: Positive for hematochezia. Negative for vomiting and abdominal pain.  Neurological: Negative for loss of consciousness and light-headedness.  All other systems reviewed and are negative.     Allergies  Risperidone and related  Home Medications   Prior to Admission medications   Medication Sig Start Date End Date Taking? Authorizing Provider  benztropine (COGENTIN) 0.5 MG tablet Take 0.5 mg by mouth at bedtime.   Yes Historical Provider, MD  citalopram (CELEXA) 40 MG tablet  Take 40 mg by mouth at bedtime.   Yes Historical Provider, MD  diphenhydrAMINE (BENADRYL) 25 MG tablet Take 25 mg by mouth every 6 (six) hours as needed for allergies.   Yes Historical Provider, MD  risperiDONE (RISPERDAL) 3 MG tablet Take 3 mg by mouth at bedtime.   Yes Historical Provider, MD   BP 109/81 mmHg  Pulse 91  Temp(Src) 98 F (36.7 C) (Oral)  Resp 16  SpO2 98%  LMP 05/26/2014 Physical Exam  Constitutional: She is oriented to person, place, and time. She appears well-developed and well-nourished. No distress.  HENT:  Head: Normocephalic and atraumatic.  Eyes: Conjunctivae are normal. No scleral icterus.  Neck: Neck supple.  Cardiovascular: Normal rate and intact distal pulses.   Pulmonary/Chest: Effort normal. No stridor. No respiratory distress.  Abdominal: Normal appearance. She exhibits no distension.  Genitourinary: Rectal exam shows fissure. Rectal exam shows no external hemorrhoid and no internal hemorrhoid.     Neurological: She is alert and oriented to person, place, and time.  Skin: Skin is warm and dry. No rash noted.  Psychiatric: She has a normal mood and affect. Her behavior is normal.  Nursing note and vitals reviewed.   ED Course  Procedures (including critical care time) Labs Review Labs Reviewed - No data to display  Imaging Review No results found.   EKG Interpretation None  MDM   Final diagnoses:  Anal fissure    34 yo femlae with constipation and rectal bleeding from anal fissure.  No complicating signs or symptoms . Plan treatment with colace and miralax.      Blake Divine, MD 08/21/14 [phone]

## 2014-08-21 NOTE — ED Notes (Signed)
Pt reports last menstrual cycle Jan this year. Seen here last month, Dx with irregular periods. Has taken a pregnancy test that was negative.

## 2014-08-21 NOTE — Discharge Instructions (Signed)
Anal Fissure, Adult An anal fissure is a small tear or crack in the skin around the anus. Bleeding from a fissure usually stops on its own within a few minutes. However, bleeding will often reoccur with each bowel movement until the crack heals.  CAUSES   Passing large, hard stools.  Frequent diarrheal stools.  Constipation.  Inflammatory bowel disease (Crohn's disease or ulcerative colitis).  Infections.  Anal sex. SYMPTOMS   Small amounts of blood seen on your stools, on toilet paper, or in the toilet after a bowel movement.  Rectal bleeding.  Painful bowel movements.  Itching or irritation around the anus. DIAGNOSIS Your caregiver will examine the anal area. An anal fissure can usually be seen with careful inspection. A rectal exam may be performed and a short tube (anoscope) may be used to examine the anal canal. TREATMENT   You may be instructed to take fiber supplements. These supplements can soften your stool to help make bowel movements easier.  Sitz baths may be recommended to help heal the tear. Do not use soap in the sitz baths.  A medicated cream or ointment may be prescribed to lessen discomfort. HOME CARE INSTRUCTIONS   Maintain a diet high in fruits, whole grains, and vegetables. Avoid constipating foods like bananas and dairy products.  Take sitz baths as directed by your caregiver.  Drink enough fluids to keep your urine clear or pale yellow.  Only take over-the-counter or prescription medicines for pain, discomfort, or fever as directed by your caregiver. Do not take aspirin as this may increase bleeding.  Do not use ointments containing numbing medications (anesthetics) or hydrocortisone. They could slow healing. SEEK MEDICAL CARE IF:   Your fissure is not completely healed within 3 days.  You have further bleeding.  You have a fever.  You have diarrhea mixed with blood.  You have pain.  Your problem is getting worse rather than  better. MAKE SURE YOU:   Understand these instructions.  Will watch your condition.  Will get help right away if you are not doing well or get worse. Document Released: 04/30/2005 Document Revised: 07/23/2011 Document Reviewed: 10/15/2010 Exit[employer] Patient Information 2015 [employer]. This information is not intended to replace advice given to you by your health care provider. Make sure you discuss any questions you have with your health care provider.  

## 2014-08-21 NOTE — ED Notes (Signed)
Pt reports rectal bleeding x3 days. Started as blood on tissue now when straining to have BM, has a few drops of blood from her rectum. Last BM Wednesday, has tried Miralax on Thursday, no relief. Denies abd pain.

## 2015-05-24 ENCOUNTER — Emergency Department (HOSPITAL_COMMUNITY): Payer: Medicaid Other

## 2015-05-24 ENCOUNTER — Emergency Department (HOSPITAL_COMMUNITY)
Admission: EM | Admit: 2015-05-24 | Discharge: 2015-05-24 | Disposition: A | Discharge status: Home / Self Care | Payer: Medicaid Other | Attending: Emergency Medicine | Admitting: Emergency Medicine

## 2015-05-24 ENCOUNTER — Encounter (HOSPITAL_COMMUNITY): Payer: Self-pay

## 2015-05-24 DIAGNOSIS — F329 Major depressive disorder, single episode, unspecified: Secondary | ICD-10-CM | POA: Insufficient documentation

## 2015-05-24 DIAGNOSIS — R05 Cough: Secondary | ICD-10-CM | POA: Diagnosis present

## 2015-05-24 DIAGNOSIS — Z862 Personal history of diseases of the blood and blood-forming organs and certain disorders involving the immune mechanism: Secondary | ICD-10-CM | POA: Insufficient documentation

## 2015-05-24 DIAGNOSIS — J069 Acute upper respiratory infection, unspecified: Secondary | ICD-10-CM | POA: Insufficient documentation

## 2015-05-24 DIAGNOSIS — F419 Anxiety disorder, unspecified: Secondary | ICD-10-CM | POA: Insufficient documentation

## 2015-05-24 DIAGNOSIS — F17210 Nicotine dependence, cigarettes, uncomplicated: Secondary | ICD-10-CM | POA: Diagnosis not present

## 2015-05-24 MED ORDER — GUAIFENESIN 100 MG/5ML PO LIQD: 100.0000 mg | ORAL | Status: DC | PRN

## 2015-05-24 NOTE — Discharge Instructions (Signed)
Upper Respiratory Infection, Adult Most upper respiratory infections (URIs) are a viral infection of the air passages leading to the lungs. A URI affects the nose, throat, and upper air passages. The most common type of URI is nasopharyngitis and is typically referred to as "the common cold." URIs run their course and usually go away on their own. Most of the time, a URI does not require medical attention, but sometimes a bacterial infection in the upper airways can follow a viral infection. This is called a secondary infection. Sinus and middle ear infections are common types of secondary upper respiratory infections. Bacterial pneumonia can also complicate a URI. A URI can worsen asthma and chronic obstructive pulmonary disease (COPD). Sometimes, these complications can require emergency medical care and may be life threatening.  CAUSES Almost all URIs are caused by viruses. A virus is a type of germ and can spread from one person to another.  RISKS FACTORS You may be at risk for a URI if:   You smoke.   You have chronic heart or lung disease.  You have a weakened defense (immune) system.   You are very young or very old.   You have nasal allergies or asthma.  You work in crowded or poorly ventilated areas.  You work in health care facilities or schools. SIGNS AND SYMPTOMS  Symptoms typically develop 2-3 days after you come in contact with a cold virus. Most viral URIs last 7-10 days. However, viral URIs from the influenza virus (flu virus) can last 14-18 days and are typically more severe. Symptoms may include:   Runny or stuffy (congested) nose.   Sneezing.   Cough.   Sore throat.   Headache.   Fatigue.   Fever.   Loss of appetite.   Pain in your forehead, behind your eyes, and over your cheekbones (sinus pain).  Muscle aches.  DIAGNOSIS  Your health care provider may diagnose a URI by:  Physical exam.  Tests to check that your symptoms are not due to  another condition such as:  Strep throat.  Sinusitis.  Pneumonia.  Asthma. TREATMENT  A URI goes away on its own with time. It cannot be cured with medicines, but medicines may be prescribed or recommended to relieve symptoms. Medicines may help:  Reduce your fever.  Reduce your cough.  Relieve nasal congestion. HOME CARE INSTRUCTIONS   Take medicines only as directed by your health care provider.   Gargle warm saltwater or take cough drops to comfort your throat as directed by your health care provider.  Use a warm mist humidifier or inhale steam from a shower to increase air moisture. This may make it easier to breathe.  Drink enough fluid to keep your urine clear or pale yellow.   Eat soups and other clear broths and maintain good nutrition.   Rest as needed.   Return to work when your temperature has returned to normal or as your health care provider advises. You may need to stay home longer to avoid infecting others. You can also use a face mask and careful hand washing to prevent spread of the virus.  Increase the usage of your inhaler if you have asthma.   Do not use any tobacco products, including cigarettes, chewing tobacco, or electronic cigarettes. If you need help quitting, ask your health care provider. PREVENTION  The best way to protect yourself from getting a cold is to practice good hygiene.   Avoid oral or hand contact with people with cold   symptoms.   Wash your hands often if contact occurs.  There is no clear evidence that vitamin C, vitamin E, echinacea, or exercise reduces the chance of developing a cold. However, it is always recommended to get plenty of rest, exercise, and practice good nutrition.  SEEK MEDICAL CARE IF:   You are getting worse rather than better.   Your symptoms are not controlled by medicine.   You have chills.  You have worsening shortness of breath.  You have brown or red mucus.  You have yellow or brown nasal  discharge.  You have pain in your face, especially when you bend forward.  You have a fever.  You have swollen neck glands.  You have pain while swallowing.  You have white areas in the back of your throat. SEEK IMMEDIATE MEDICAL CARE IF:   You have severe or persistent:  Headache.  Ear pain.  Sinus pain.  Chest pain.  You have chronic lung disease and any of the following:  Wheezing.  Prolonged cough.  Coughing up blood.  A change in your usual mucus.  You have a stiff neck.  You have changes in your:  Vision.  Hearing.  Thinking.  Mood. MAKE SURE YOU:   Understand these instructions.  Will watch your condition.  Will get help right away if you are not doing well or get worse.   This information is not intended to replace advice given to you by your health care provider. Make sure you discuss any questions you have with your health care provider.   Document Released: 10/24/2000 Document Revised: 09/14/2014 Document Reviewed: 08/05/2013 Elsevier Interactive Patient Education 2016 [employer]  

## 2015-05-24 NOTE — ED Notes (Signed)
Pt with cough and nasal congestion since Sunday night.  No fever.  Cough is steady.  Tried meds and helped for a little while.  Cough productive

## 2015-05-24 NOTE — ED Provider Notes (Signed)
History  By signing my name below, I, Karle PlumberJennifer Tensley, attest that this documentation has been prepared under the direction and in the presence of Rob South Fryde[place]rowning, [place]-C. Electronically Signed: Karle PlumberJennifer Tensley, ED Scribe. 05/24/2015. 12:13 PM.  Chief Complaint  Patient presents with  . Nasal Congestion  . Cough   The history is provided by the patient and medical records. No language interpreter was used.    HPI Comments:  Alicia Flores is a 35 y.o. obese female who presents to the Emergency Department complaining of productive cough that began about two days ago. She reports associated rhinorrhea. She has taken OTC medication for congestion to treat her symptoms with minimal relief. She denies modifying factors. She denies fever, chills, nausea, vomiting, sore throat, otalgia.  Past Medical History  Diagnosis Date  . Anemia   . Sickle cell anemia (HCC)     sickle cell trait  . Depression   . Anxiety    Past Surgical History  Procedure Laterality Date  . Cesarean section      FTP  . Foot surgery     Family History  Problem Relation Age of Onset  . Hypertension Mother    Social History  Substance Use Topics  . Smoking status: Current Some Day Smoker -- 0.50 packs/day    Types: Cigarettes    Last Attempt to Quit: 01/11/2011  . Smokeless tobacco: Never Used  . Alcohol Use: No     Comment: social   OB History    Gravida Para Term Preterm AB TAB SAB Ectopic Multiple Living   3 1 1  1  1   1      Review of Systems  Constitutional: Negative for fever and chills.  HENT: Positive for congestion and rhinorrhea. Negative for ear pain and sore throat.   Respiratory: Positive for cough.   Gastrointestinal: Negative for nausea and vomiting.    Allergies  Risperidone and related  Home Medications   Prior to Admission medications   Medication Sig Start Date End Date Taking? Authorizing Provider  benztropine (COGENTIN) 0.5 MG tablet Take 0.5 mg by mouth at bedtime.     Historical Provider, MD  citalopram (CELEXA) 40 MG tablet Take 40 mg by mouth at bedtime.    Historical Provider, MD  diphenhydrAMINE (BENADRYL) 25 MG tablet Take 25 mg by mouth every 6 (six) hours as needed for allergies.    Historical Provider, MD  docusate sodium (COLACE) 100 MG capsule Take 1 capsule (100 mg total) by mouth 2 (two) times daily as needed for mild constipation or moderate constipation. 08/21/14   Blake DivineJohn Wofford, MD  risperiDONE (RISPERDAL) 3 MG tablet Take 3 mg by mouth at bedtime.    Historical Provider, MD   Triage Vitals: BP 141/102 mmHg  Pulse 87  Temp(Src) 98.2 F (36.8 C) (Oral)  Resp 18  SpO2 98%  LMP  Physical Exam Physical Exam  Constitutional: Pt  is oriented to person, place, and time. Appears well-developed and well-nourished. No distress.  HENT:  Head: Normocephalic and atraumatic.  Right Ear: Tympanic membrane, external ear and ear canal normal.  Left Ear: Tympanic membrane, external ear and ear canal normal.  Nose: Mucosal edema and moderate rhinorrhea present. No epistaxis. Right sinus exhibits no maxillary sinus tenderness and no frontal sinus tenderness. Left sinus exhibits no maxillary sinus tenderness and no frontal sinus tenderness.  Mouth/Throat: Uvula is midline and mucous membranes are normal. Mucous membranes are not pale and not cyanotic. No oropharyngeal exudate, posterior oropharyngeal edema, posterior  oropharyngeal erythema or tonsillar abscesses.  Eyes: Conjunctivae are normal. Pupils are equal, round, and reactive to light.  Neck: Normal range of motion and full passive range of motion without pain.  Cardiovascular: Normal rate and intact distal pulses.   Pulmonary/Chest: Effort normal and breath sounds normal. No stridor.  Clear and equal breath sounds without focal wheezes, rhonchi, rales  Abdominal: Soft. Bowel sounds are normal. There is no tenderness.  Musculoskeletal: Normal range of motion.  Lymphadenopathy:    Pthas no cervical  adenopathy.  Neurological: Pt is alert and oriented to person, place, and time.  Skin: Skin is warm and dry. No rash noted. Pt is not diaphoretic.  Psychiatric: Normal mood and affect.  Nursing note and vitals reviewed.   ED Course  Procedures (including critical care time) DIAGNOSTIC STUDIES: Oxygen Saturation is 98% on RA, normal by my interpretation.   COORDINATION OF CARE: 12:29 PM- Advised pt to take OTC Zyrtec or Claritin and will prescribe cough medication. Informed pt CXR was negative and antibiotics are not indicated at this time. Pt verbalizes understanding and agrees to plan.  Medications - No data to display  Imaging Review Dg Chest 2 View  05/24/2015  CLINICAL DATA:  Cough and congestion for 3 days EXAM: CHEST  2 VIEW COMPARISON:  None. FINDINGS: Normal heart size and mediastinal contours. No acute infiltrate or edema. No effusion or pneumothorax. No acute osseous findings. IMPRESSION: Negative chest. Electronically Signed   By: Marnee Spring M.D.   On: 05/24/2015 12:06   I have personally reviewed and evaluated these images and lab results as part of my medical decision-making.   MDM   Final diagnoses:  URI (upper respiratory infection)    Pt CXR negative for acute infiltrate. Patients symptoms are consistent with URI, likely viral etiology. Discussed that antibiotics are not indicated for viral infections. Pt will be discharged with symptomatic treatment.  Verbalizes understanding and is agreeable with plan. Pt is hemodynamically stable & in NAD prior to dc.   I personally performed the services described in this documentation, which was scribed in my presence. The recorded information has been reviewed and is accurate.       Roxy Horseman, PA-C 05/24/15 1233  Cathren Laine, MD 05/31/15 [phone]

## 2015-05-28 ENCOUNTER — Emergency Department (HOSPITAL_COMMUNITY)
Admission: EM | Admit: 2015-05-28 | Discharge: 2015-05-28 | Disposition: A | Discharge status: Home / Self Care | Payer: Medicaid Other | Attending: Emergency Medicine | Admitting: Emergency Medicine

## 2015-05-28 ENCOUNTER — Encounter (HOSPITAL_COMMUNITY): Payer: Self-pay

## 2015-05-28 DIAGNOSIS — Z79899 Other long term (current) drug therapy: Secondary | ICD-10-CM | POA: Diagnosis not present

## 2015-05-28 DIAGNOSIS — F329 Major depressive disorder, single episode, unspecified: Secondary | ICD-10-CM | POA: Diagnosis not present

## 2015-05-28 DIAGNOSIS — F419 Anxiety disorder, unspecified: Secondary | ICD-10-CM | POA: Diagnosis not present

## 2015-05-28 DIAGNOSIS — N939 Abnormal uterine and vaginal bleeding, unspecified: Secondary | ICD-10-CM

## 2015-05-28 DIAGNOSIS — F17210 Nicotine dependence, cigarettes, uncomplicated: Secondary | ICD-10-CM | POA: Insufficient documentation

## 2015-05-28 DIAGNOSIS — Z862 Personal history of diseases of the blood and blood-forming organs and certain disorders involving the immune mechanism: Secondary | ICD-10-CM | POA: Insufficient documentation

## 2015-05-28 DIAGNOSIS — Z3202 Encounter for pregnancy test, result negative: Secondary | ICD-10-CM | POA: Diagnosis not present

## 2015-05-28 LAB — PREGNANCY, URINE: Preg Test, Ur: NEGATIVE

## 2015-05-28 LAB — URINALYSIS, ROUTINE W REFLEX MICROSCOPIC
Bilirubin Urine: NEGATIVE
GLUCOSE, UA: NEGATIVE mg/dL
KETONES UR: NEGATIVE mg/dL
LEUKOCYTES UA: NEGATIVE
NITRITE: NEGATIVE
PROTEIN: NEGATIVE mg/dL
Specific Gravity, Urine: 1.019 (ref 1.005–1.030)
pH: 6.5 (ref 5.0–8.0)

## 2015-05-28 LAB — URINE MICROSCOPIC-ADD ON
Bacteria, UA: NONE SEEN
WBC, UA: NONE SEEN WBC/hpf (ref 0–5)

## 2015-05-28 NOTE — ED Provider Notes (Signed)
CSN: [id number]     Arrival date & time 05/28/15  1427 History   First MD Initiated Contact with Patient 05/28/15 1525     Chief Complaint  Patient presents with  . Vaginal Bleeding      HPI  Pt presents for evaluation of vaginal bleeding and spotting. She hasn't had a menstrual cycle 5 months. No recent history of hormone use. No nipple. No implanted hormones. Sensation is a little bit of cramping in her lower abdomen. Noticed some blood at home. Additional symptoms. No vaginal discharge. No dysuria frequency or hematuria.  Past Medical History  Diagnosis Date  . Anemia   . Sickle cell anemia (HCC)     sickle cell trait  . Depression   . Anxiety    Past Surgical History  Procedure Laterality Date  . Cesarean section      FTP  . Foot surgery     Family History  Problem Relation Age of Onset  . Hypertension Mother    Social History  Substance Use Topics  . Smoking status: Current Some Day Smoker -- 0.50 packs/day    Types: Cigarettes    Last Attempt to Quit: 01/11/2011  . Smokeless tobacco: Never Used  . Alcohol Use: No     Comment: social   OB History    Gravida Para Term Preterm AB TAB SAB Ectopic Multiple Living   3 1 1  1  1   1      Review of Systems  Constitutional: Negative for fever, chills, diaphoresis, appetite change and fatigue.  HENT: Negative for mouth sores, sore throat and trouble swallowing.   Eyes: Negative for visual disturbance.  Respiratory: Negative for cough, chest tightness, shortness of breath and wheezing.   Cardiovascular: Negative for chest pain.  Gastrointestinal: Negative for nausea, vomiting, abdominal pain, diarrhea and abdominal distention.  Endocrine: Negative for polydipsia, polyphagia and polyuria.  Genitourinary: Positive for vaginal bleeding and menstrual problem. Negative for dysuria, frequency and hematuria.  Musculoskeletal: Negative for gait problem.  Skin: Negative for color change, pallor and rash.  Neurological:  Negative for dizziness, syncope, light-headedness and headaches.  Hematological: Does not bruise/bleed easily.  Psychiatric/Behavioral: Negative for behavioral problems and confusion.      Allergies  Risperidone and related  Home Medications   Prior to Admission medications   Medication Sig Start Date End Date Taking? Authorizing Provider  Amantadine HCl 100 MG tablet Take 100 mg by mouth at bedtime.  05/25/15  Yes Historical Provider, MD  benztropine (COGENTIN) 0.5 MG tablet Take 0.5 mg by mouth at bedtime.   Yes Historical Provider, MD  citalopram (CELEXA) 40 MG tablet Take 40 mg by mouth at bedtime.   Yes Historical Provider, MD  diphenhydrAMINE (BENADRYL) 25 MG tablet Take 25 mg by mouth every 6 (six) hours as needed for allergies.   Yes Historical Provider, MD  guaiFENesin (ROBITUSSIN) 100 MG/5ML liquid Take 5-10 mLs (100-200 mg total) by mouth every 4 (four) hours as needed for cough. 05/24/15  Yes Roxy Horseman, PA-C  pseudoephedrine (SUDAFED) 30 MG tablet Take 30 mg by mouth every 4 (four) hours as needed for congestion.   Yes Historical Provider, MD  docusate sodium (COLACE) 100 MG capsule Take 1 capsule (100 mg total) by mouth 2 (two) times daily as needed for mild constipation or moderate constipation. 08/21/14   Blake Divine, MD   BP 139/104 mmHg  Pulse 79  Temp(Src) 98.7 F (37.1 C) (Oral)  Resp 18  SpO2 100%  LMP  Physical Exam  Constitutional: She is oriented to person, place, and time. She appears well-developed and well-nourished. No distress.  HENT:  Head: Normocephalic.  Eyes: Conjunctivae are normal. Pupils are equal, round, and reactive to light. No scleral icterus.  Neck: Normal range of motion. Neck supple. No thyromegaly present.  Cardiovascular: Normal rate and regular rhythm.  Exam reveals no gallop and no friction rub.   No murmur heard. Pulmonary/Chest: Effort normal and breath sounds normal. No respiratory distress. She has no wheezes. She has no rales.   Abdominal: Soft. Bowel sounds are normal. She exhibits no distension. There is no tenderness. There is no rebound.  Genitourinary:    Musculoskeletal: Normal range of motion.  Neurological: She is alert and oriented to person, place, and time.  Skin: Skin is warm and dry. No rash noted.  Psychiatric: She has a normal mood and affect. Her behavior is normal.    ED Course  Procedures (including critical care time) Labs Review Labs Reviewed  URINALYSIS, ROUTINE W REFLEX MICROSCOPIC (NOT [facility]) - Abnormal; Notable for the following:    Hgb urine dipstick LARGE (*)    All other components within normal limits  URINE MICROSCOPIC-ADD ON - Abnormal; Notable for the following:    Squamous Epithelial / LPF 0-5 (*)    All other components within normal limits  URINE CULTURE  PREGNANCY, URINE    Imaging Review No results found. I have personally reviewed and evaluated these images and lab results as part of my medical decision-making.   EKG Interpretation None      MDM   Final diagnoses:  Vaginal bleeding    Neg pregnancy.  No TTP on exam.  Pt appropriate for DC and out patient f/u.    Rolland PorterMark Chavy Avera, MD 05/28/15 1729

## 2015-05-28 NOTE — ED Notes (Signed)
She states she has scant vag. Bleeding without fever/abd. Pain and is in no distress.

## 2015-05-28 NOTE — ED Notes (Signed)
Pt reported sudden onset of moderate amt vaginal bleeding. Missed periods in last 5 months. "A little bit" of lower abd pain. Denies vaginal d/c, fever and urinary problems.

## 2015-05-28 NOTE — ED Notes (Signed)
Awake. Verbally responsive. A/O x4. Resp even and unlabored. No audible adventitious breath sounds noted. ABC's intact.  

## 2015-05-28 NOTE — Discharge Instructions (Signed)

## 2015-05-30 LAB — URINE CULTURE

## 2015-07-19 ENCOUNTER — Inpatient Hospital Stay (HOSPITAL_COMMUNITY)
Admission: AD | Admit: 2015-07-19 | Discharge: 2015-07-19 | Disposition: A | Discharge status: Home / Self Care | Payer: Medicare Other | Source: Ambulatory Visit | Attending: Family Medicine | Admitting: Family Medicine

## 2015-07-19 ENCOUNTER — Encounter (HOSPITAL_COMMUNITY): Payer: Self-pay | Admitting: *Deleted

## 2015-07-19 DIAGNOSIS — F17210 Nicotine dependence, cigarettes, uncomplicated: Secondary | ICD-10-CM | POA: Insufficient documentation

## 2015-07-19 DIAGNOSIS — N920 Excessive and frequent menstruation with regular cycle: Secondary | ICD-10-CM | POA: Insufficient documentation

## 2015-07-19 DIAGNOSIS — N939 Abnormal uterine and vaginal bleeding, unspecified: Secondary | ICD-10-CM | POA: Insufficient documentation

## 2015-07-19 DIAGNOSIS — D571 Sickle-cell disease without crisis: Secondary | ICD-10-CM | POA: Diagnosis not present

## 2015-07-19 DIAGNOSIS — F329 Major depressive disorder, single episode, unspecified: Secondary | ICD-10-CM | POA: Diagnosis not present

## 2015-07-19 DIAGNOSIS — F419 Anxiety disorder, unspecified: Secondary | ICD-10-CM | POA: Insufficient documentation

## 2015-07-19 DIAGNOSIS — F1410 Cocaine abuse, uncomplicated: Secondary | ICD-10-CM | POA: Insufficient documentation

## 2015-07-19 HISTORY — DX: Other psychoactive substance abuse, uncomplicated: F19.10

## 2015-07-19 HISTORY — DX: Irregular menstruation, unspecified: N92.6

## 2015-07-19 HISTORY — DX: Cocaine abuse, uncomplicated: F14.10

## 2015-07-19 LAB — URINALYSIS, ROUTINE W REFLEX MICROSCOPIC
BILIRUBIN URINE: NEGATIVE
GLUCOSE, UA: NEGATIVE mg/dL
Ketones, ur: 15 mg/dL — AB
NITRITE: NEGATIVE
PH: 5.5 (ref 5.0–8.0)
Protein, ur: 30 mg/dL — AB
Specific Gravity, Urine: 1.01 (ref 1.005–1.030)

## 2015-07-19 LAB — CBC
HEMATOCRIT: 37.9 % (ref 36.0–46.0)
HEMOGLOBIN: 13.2 g/dL (ref 12.0–15.0)
MCH: 28.6 pg (ref 26.0–34.0)
MCHC: 34.8 g/dL (ref 30.0–36.0)
MCV: 82 fL (ref 78.0–100.0)
Platelets: 366 10*3/uL (ref 150–400)
RBC: 4.62 MIL/uL (ref 3.87–5.11)
RDW: 14.2 % (ref 11.5–15.5)
WBC: 7.6 10*3/uL (ref 4.0–10.5)

## 2015-07-19 LAB — WET PREP, GENITAL
Clue Cells Wet Prep HPF POC: NONE SEEN
SPERM: NONE SEEN
Trich, Wet Prep: NONE SEEN
Yeast Wet Prep HPF POC: NONE SEEN

## 2015-07-19 LAB — URINE MICROSCOPIC-ADD ON: BACTERIA UA: NONE SEEN

## 2015-07-19 LAB — POCT PREGNANCY, URINE: PREG TEST UR: NEGATIVE

## 2015-07-19 NOTE — MAU Provider Note (Signed)
Chief Complaint: Vaginal Bleeding   First Provider Initiated Contact with Patient 07/19/15 1526      SUBJECTIVE HPI: Alicia Flores is a 35 y.o. G3P1011  who presents to maternity admissions reporting heavy menstrual periods x 6 months.  She reports her periods are usually regular, and were lighter and shorter before but now last 7 days and with heavier flow. Her current period started 4 days ago and is still bright red and requiring a pad change every 2-3 hours.   She denies abdominal pain, vaginal itching/burning, urinary symptoms, h/a, dizziness, n/v, or fever/chills.     HPI  Past Medical History  Diagnosis Date  . Anemia   . Sickle cell anemia (HCC)     sickle cell trait  . Depression   . Anxiety   . Irregular menstrual bleeding   . Cocaine abuse   . Substance abuse    Past Surgical History  Procedure Laterality Date  . Cesarean section      FTP  . Foot surgery     Social History   Social History  . Marital Status: Single    Spouse Name: N/A  . Number of Children: N/A  . Years of Education: N/A   Occupational History  . Not on file.   Social History Main Topics  . Smoking status: Current Some Day Smoker -- 0.50 packs/day    Types: Cigarettes    Last Attempt to Quit: 01/11/2011  . Smokeless tobacco: Never Used  . Alcohol Use: No     Comment: social  . Drug Use: Yes    Special: Cocaine, Marijuana     Comment: 1 gm every 2 days, Cocaine last used "a couple of days ago."  . Sexual Activity: Yes    Birth Control/ Protection: None   Other Topics Concern  . Not on file   Social History Narrative   No current facility-administered medications on file prior to encounter.   Current Outpatient Prescriptions on File Prior to Encounter  Medication Sig Dispense Refill  . Amantadine HCl 100 MG tablet Take 100 mg by mouth at bedtime.     . benztropine (COGENTIN) 0.5 MG tablet Take 0.5 mg by mouth at bedtime.    . citalopram (CELEXA) 40 MG tablet Take 40 mg by mouth  at bedtime.     Allergies  Allergen Reactions  . Risperidone And Related Other (See Comments)    Excessive weight gain     ROS:  Review of Systems  Constitutional: Negative for fever, chills and fatigue.  Respiratory: Negative for shortness of breath.   Cardiovascular: Negative for chest pain.  Genitourinary: Positive for vaginal bleeding and pelvic pain. Negative for dysuria, flank pain, vaginal discharge, difficulty urinating and vaginal pain.  Neurological: Negative for dizziness and headaches.  Psychiatric/Behavioral: Negative.      I have reviewed patient's Past Medical Hx, Surgical Hx, Family Hx, Social Hx, medications and allergies.   Physical Exam  Patient Vitals for the past 24 hrs:  BP Temp Temp src Pulse Resp  07/19/15 1451 129/82 mmHg 98.1 F (36.7 C) Oral 105 20   Constitutional: Well-developed, well-nourished female in no acute distress.  Cardiovascular: normal rate Respiratory: normal effort GI: Abd soft, non-tender. Pos BS x 4 MS: Extremities nontender, no edema, normal ROM Neurologic: Alert and oriented x 4.  GU: Neg CVAT.  PELVIC EXAM: Cervix pink, visually closed, without lesion, small amount dark red bleeding, vaginal walls and external genitalia normal Bimanual exam: Cervix 0/long/high, firm, anterior, neg CMT,  uterus mildly tender, nonenlarged, adnexa without tenderness, enlargement, or mass   LAB RESULTS Results for orders placed or performed during the hospital encounter of 07/19/15 (from the past 24 hour(s))  Urinalysis, Routine w reflex microscopic (not at [facility])     Status: Abnormal   Collection Time: 07/19/15  2:42 PM  Result Value Ref Range   Color, Urine AMBER (A) YELLOW   APPearance HAZY (A) CLEAR   Specific Gravity, Urine 1.010 1.005 - 1.030   pH 5.5 5.0 - 8.0   Glucose, UA NEGATIVE NEGATIVE mg/dL   Hgb urine dipstick LARGE (A) NEGATIVE   Bilirubin Urine NEGATIVE NEGATIVE   Ketones, ur 15 (A) NEGATIVE mg/dL   Protein, ur 30 (A)  NEGATIVE mg/dL   Nitrite NEGATIVE NEGATIVE   Leukocytes, UA TRACE (A) NEGATIVE  Urine microscopic-add on     Status: Abnormal   Collection Time: 07/19/15  2:42 PM  Result Value Ref Range   Squamous Epithelial / LPF 0-5 (A) NONE SEEN   WBC, UA 0-5 0 - 5 WBC/hpf   RBC / HPF TOO NUMEROUS TO COUNT 0 - 5 RBC/hpf   Bacteria, UA NONE SEEN NONE SEEN  Pregnancy, urine POC     Status: None   Collection Time: 07/19/15  2:48 PM  Result Value Ref Range   Preg Test, Ur NEGATIVE NEGATIVE  CBC     Status: None   Collection Time: 07/19/15  3:41 PM  Result Value Ref Range   WBC 7.6 4.0 - 10.5 K/uL   RBC 4.62 3.87 - 5.11 MIL/uL   Hemoglobin 13.2 12.0 - 15.0 g/dL   HCT 16.1 09.6 - 04.5 %   MCV 82.0 78.0 - 100.0 fL   MCH 28.6 26.0 - 34.0 pg   MCHC 34.8 30.0 - 36.0 g/dL   RDW 40.9 81.1 - 91.4 %   Platelets 366 150 - 400 K/uL       IMAGING No results found.  MAU Management/MDM: Ordered labs and reviewed results.  Hemoglobin 13.2 and pt without s/sx of anemia.  Bleeding minimal on physical exam.  Pt stable during today's exam.  Plan for pt to follow up outpatient with Gyn to evaluate for abnormal bleeding. Pt stable at time of discharge.  ASSESSMENT  1. Abnormal uterine bleeding (AUB)   2. Menorrhagia with regular cycle     PLAN Discharge home  Follow-up Information    Follow up with [facility].   Specialty:  Obstetrics and Gynecology   Why:  For follow up and routine Gyn care, Return to MAU as needed for emergencies   Contact information:   [address] [place] [address] [phone]       Medication List    ASK your doctor about these medications        Amantadine HCl 100 MG tablet  Take 100 mg by mouth at bedtime.     benztropine 0.5 MG tablet  Commonly known as:  COGENTIN  Take 0.5 mg by mouth at bedtime.     citalopram 40 MG tablet  Commonly known as:  CELEXA  Take 40 mg by mouth at bedtime.     CRANBERRY PO  Take 2 tablets by  mouth daily.     docusate sodium 100 MG capsule  Commonly known as:  COLACE  Take 200 mg by mouth daily as needed for mild constipation.         Sharen Counter Certified Nurse-Midwife 07/19/2015  4:21 PM  m °

## 2015-07-19 NOTE — Discharge Instructions (Signed)
Abnormal Uterine Bleeding °Abnormal uterine bleeding can affect women at various stages in life, including teenagers, women in their reproductive years, pregnant women, and women who have reached menopause. Several kinds of uterine bleeding are considered abnormal, including: °· Bleeding or spotting between periods.   °· Bleeding after sexual intercourse.   °· Bleeding that is heavier or more than normal.   °· Periods that last longer than usual. °· Bleeding after menopause.   °Many cases of abnormal uterine bleeding are minor and simple to treat, while others are more serious. Any type of abnormal bleeding should be evaluated by your health care provider. Treatment will depend on the cause of the bleeding. °HOME CARE INSTRUCTIONS °Monitor your condition for any changes. The following actions may help to alleviate any discomfort you are experiencing: °· Avoid the use of tampons and douches as directed by your health care provider. °· Change your pads frequently. °You should get regular pelvic exams and Pap tests. Keep all follow-up appointments for diagnostic tests as directed by your health care provider.  °SEEK MEDICAL CARE IF:  °· Your bleeding lasts more than 1 week.   °· You feel dizzy at times.   °SEEK IMMEDIATE MEDICAL CARE IF:  °· You pass out.   °· You are changing pads every 15 to 30 minutes.   °· You have abdominal pain. °· You have a fever.   °· You become sweaty or weak.   °· You are passing large blood clots from the vagina.   °· You start to feel nauseous and vomit. °MAKE SURE YOU:  °· Understand these instructions. °· Will watch your condition. °· Will get help right away if you are not doing well or get worse. °  °This information is not intended to replace advice given to you by your health care provider. Make sure you discuss any questions you have with your health care provider. °  °Document Released: 04/30/2005 Document Revised: 05/05/2013 Document Reviewed: 11/27/2012 °Elsevier Interactive  Patient Education ©2016 [employer] ° °Menorrhagia °Menorrhagia is the medical term for when your menstrual periods are heavy or last longer than usual. With menorrhagia, every period you have may cause enough blood loss and cramping that you are unable to maintain your usual activities. °CAUSES  °In some cases, the cause of heavy periods is unknown, but a number of conditions may cause menorrhagia. Common causes include: °· A problem with the hormone-producing thyroid gland (hypothyroid). °· Noncancerous growths in the uterus (polyps or fibroids). °· An imbalance of the estrogen and progesterone hormones. °· One of your ovaries not releasing an egg during one or more months. °· Side effects of having an intrauterine device (IUD). °· Side effects of some medicines, such as anti-inflammatory medicines or blood thinners. °· A bleeding disorder that stops your blood from clotting normally. °SIGNS AND SYMPTOMS  °During a normal period, bleeding lasts between 4 and 8 days. Signs that your periods are too heavy include: °· You routinely have to change your pad or tampon every 1 or 2 hours because it is completely soaked. °· You pass blood clots larger than 1 inch (2.5 cm) in size. °· You have bleeding for more than 7 days. °· You need to use pads and tampons at the same time because of heavy bleeding. °· You need to wake up to change your pads or tampons during the night. °· You have symptoms of anemia, such as tiredness, fatigue, or shortness of breath.  °DIAGNOSIS  °Your health care provider will perform a physical exam and ask you questions about your   symptoms and menstrual history. Other tests may be ordered based on what the health care provider finds during the exam. These tests can include: °· Blood tests. Blood tests are used to check if you are pregnant or have hormonal changes, a bleeding or thyroid disorder, low iron levels (anemia), or other problems. °· Endometrial biopsy. Your health care provider takes a  sample of tissue from the inside of your uterus to be examined under a microscope. °· Pelvic ultrasound. This test uses sound waves to make a picture of your uterus, ovaries, and vagina. The pictures can show if you have fibroids or other growths. °· Hysteroscopy. For this test, your health care provider will use a small telescope to look inside your uterus. °Based on the results of your initial tests, your health care provider may recommend further testing. °TREATMENT  °Treatment may not be needed. If it is needed, your health care provider may recommend treatment with one or more medicines first. If these do not reduce bleeding enough, a surgical treatment might be an option. The best treatment for you will depend on:  °· Whether you need to prevent pregnancy.   °· Your desire to have children in the future. °· The cause and severity of your bleeding. °· Your opinion and personal preference.   °Medicines for menorrhagia may include: °· Birth control methods that use hormones. These include the pill, skin patch, vaginal ring, shots that you get every 3 months, hormonal IUD, and implant. These treatments reduce bleeding during your menstrual period. °· Medicines that thicken blood and slow bleeding. °· Medicines that reduce swelling, such as ibuprofen.  °· Medicines that contain a synthetic hormone called progestin.   °· Medicines that make the ovaries stop working for a short time.   °You may need surgical treatment for menorrhagia if the medicines are unsuccessful. Treatment options include: °· Dilation and curettage (D&C). In this procedure, your health care provider opens (dilates) your cervix and then scrapes or suctions tissue from the lining of your uterus to reduce menstrual bleeding. °· Operative hysteroscopy. This procedure uses a tiny tube with a light (hysteroscope) to view your uterine cavity and can help in the surgical removal of a polyp that may be causing heavy periods. °· Endometrial ablation.  Through various techniques, your health care provider permanently destroys the entire lining of your uterus (endometrium). After endometrial ablation, most women have little or no menstrual flow. Endometrial ablation reduces your ability to become pregnant. °· Endometrial resection. This surgical procedure uses an electrosurgical wire loop to remove the lining of the uterus. This procedure also reduces your ability to become pregnant. °· Hysterectomy. Surgical removal of the uterus and cervix is a permanent procedure that stops menstrual periods. Pregnancy is not possible after a hysterectomy. This procedure requires anesthesia and hospitalization. °HOME CARE INSTRUCTIONS  °· Only take over-the-counter or prescription medicines as directed by your health care provider. Take prescribed medicines exactly as directed. Do not change or switch medicines without consulting your health care provider. °· Take any prescribed iron pills exactly as directed by your health care provider. Long-term heavy bleeding may result in low iron levels. Iron pills help replace the iron your body lost from heavy bleeding. Iron may cause constipation. If this becomes a problem, increase the bran, fruits, and roughage in your diet. °· Do not take aspirin or medicines that contain aspirin 1 week before or during your menstrual period. Aspirin may make the bleeding worse. °· If you need to change your sanitary pad or tampon more   than once every 2 hours, stay in bed and rest as much as possible until the bleeding stops. °· Eat well-balanced meals. Eat foods high in iron. Examples are leafy green vegetables, meat, liver, eggs, and whole grain breads and cereals. Do not try to lose weight until the abnormal bleeding has stopped and your blood iron level is back to normal. °SEEK MEDICAL CARE IF:  °· You soak through a pad or tampon every 1 or 2 hours, and this happens every time you have a period. °· You need to use pads and tampons at the same  time because you are bleeding so much. °· You need to change your pad or tampon during the night. °· You have a period that lasts for more than 8 days. °· You pass clots bigger than 1 inch wide. °· You have irregular periods that happen more or less often than once a month. °· You feel dizzy or faint. °· You feel very weak or tired. °· You feel short of breath or feel your heart is beating too fast when you exercise. °· You have nausea and vomiting or diarrhea while you are taking your medicine. °· You have any problems that may be related to the medicine you are taking. °SEEK IMMEDIATE MEDICAL CARE IF:  °· You soak through 4 or more pads or tampons in 2 hours. °· You have any bleeding while you are pregnant. °MAKE SURE YOU:  °· Understand these instructions. °· Will watch your condition. °· Will get help right away if you are not doing well or get worse. °  °This information is not intended to replace advice given to you by your health care provider. Make sure you discuss any questions you have with your health care provider. °  °Document Released: 04/30/2005 Document Revised: 05/05/2013 Document Reviewed: 10/19/2012 °Elsevier Interactive Patient Education ©2016 [employer] ° °

## 2015-07-19 NOTE — MAU Note (Signed)
States she has had very irregular periods for about 6 months and when she has one, it is usually very heavy. Wants to know what is going on.

## 2015-07-19 NOTE — Progress Notes (Signed)
History   CSN: [id number]  Arrival date and time: 07/19/15 1420  First Provider Initiated Contact with Patient 07/19/15 1526      Chief Complaint  Patient presents with  . Vaginal Bleeding   HPI  Alicia Flores is a 35 yo AA F [id number] presenting for evaluation today of abnormal heavy menstrual bleeding. The patient reports menarche at age 35 with regular in volume, flow, and duration periods every 28 days lasting 4-5 days until around 6 months ago. Since that time, her periods have been occuring monthly, but per the patient now last 7 days and are "really heavy." The patient endorses wearing Depends and having to change 3-4 pairs a day due to heavy bleeding. The patient denies any associated pain with bleeding. The patient is sexually active with one partner and does not use birth control, endorsing infrequent barrier method usage. Her last sexual activity was three weeks ago. The patient denies vaginal trauma or painful sexual activity. The patient denies an implantable uterine device. The patient does not currently work and consumes a poor ad libitum diet. She gets physical activity each day by "cleaning the house and taking care of my 33 year old." The patient endorses mild upper abdominal tenderness to pressure, but denies chest pain, SOB, palpitations, recent illness, sick contacts at home, fever, fatigue, weight loss, night sweats, nausea, vomiting, diarrhea, or constipation.  OB History    Gravida Para Term Preterm AB TAB SAB Ectopic Multiple Living   3 1 1  1  1   1       Past Medical History  Diagnosis Date  . Anemia   . Sickle cell anemia (HCC)     sickle cell trait  . Depression   . Anxiety   . Irregular menstrual bleeding   . Cocaine abuse   . Substance abuse     Past Surgical History  Procedure Laterality Date  . Cesarean section      FTP  . Foot surgery      Family History  Problem Relation Age of Onset  . Hypertension Mother     Social History  Substance Use  Topics  . Smoking status: Current Some Day Smoker -- 0.50 packs/day    Types: Cigarettes    Last Attempt to Quit: 01/11/2011  . Smokeless tobacco: Never Used  . Alcohol Use: No     Comment: social    Allergies:  Allergies  Allergen Reactions  . Risperidone And Related Other (See Comments)    Excessive weight gain     Prescriptions prior to admission  Medication Sig Dispense Refill Last Dose  . Amantadine HCl 100 MG tablet Take 100 mg by mouth at bedtime.    07/18/2015 at Unknown time  . benztropine (COGENTIN) 0.5 MG tablet Take 0.5 mg by mouth at bedtime.   07/18/2015 at Unknown time  . citalopram (CELEXA) 40 MG tablet Take 40 mg by mouth at bedtime.   07/18/2015 at Unknown time  . CRANBERRY PO Take 2 tablets by mouth daily.   07/19/2015 at Unknown time  . docusate sodium (COLACE) 100 MG capsule Take 200 mg by mouth daily as needed for mild constipation.   07/19/2015 at Unknown time    Review of Systems  Constitutional: Negative for fever, chills, weight loss and malaise/fatigue.  HENT: Negative for congestion, nosebleeds and sore throat.   Respiratory: Negative for cough, shortness of breath and wheezing.   Cardiovascular: Negative for chest pain, palpitations, claudication and leg swelling.  Gastrointestinal: Positive  for abdominal pain. Negative for heartburn, nausea, vomiting, diarrhea, constipation and blood in stool.  Genitourinary: Negative for dysuria, urgency, frequency, hematuria and flank pain.  Musculoskeletal: Negative for myalgias and joint pain.  Skin: Negative for itching and rash.  Neurological: Negative for dizziness, tremors, focal weakness, seizures, loss of consciousness, weakness and headaches.  Endo/Heme/Allergies: Negative for polydipsia. Does not bruise/bleed easily.   Physical Exam   Blood pressure 129/82, pulse 105, temperature 98.1 F (36.7 C), temperature source Oral, resp. rate 20, last menstrual period 07/15/2015.  Physical Exam  Constitutional: She is  oriented to person, place, and time. She appears well-developed and well-nourished. No distress.  HENT:  Head: Normocephalic and atraumatic.  Neck: Normal range of motion. Neck supple. No thyromegaly present.  Cardiovascular: Normal rate, regular rhythm, normal heart sounds and intact distal pulses.  Exam reveals no gallop and no friction rub.   No murmur heard. Respiratory: Effort normal and breath sounds normal. No respiratory distress. She has no wheezes. She exhibits no tenderness.  GI: Soft. Bowel sounds are normal. She exhibits no distension and no mass. There is tenderness (mild to palpation) in the right upper quadrant. There is no rebound, no guarding and no CVA tenderness.  Genitourinary: Pelvic exam was performed with patient supine. There is no rash, lesion or injury on the right labia. There is no rash, lesion or injury on the left labia. There is bleeding in the vagina. No tenderness in the vagina. No foreign body around the vagina. No signs of injury around the vagina. No vaginal discharge found.  Musculoskeletal: Normal range of motion.  Neurological: She is alert and oriented to person, place, and time.  Skin: Skin is warm. No rash noted. No erythema. No pallor.    MAU Course  Procedures  MDM 1. G/C std screening 2. Vaginal wet prep  Assessment and Plan  Non-emergent vaginal bleeding 1. Rule out emergent causes of bleeding pending lab results 2. Review results with patient. Ensure patient understands results. 3. Refer patient to OB/GYN. Offered to provide [facility] clinic information to patient to establish herself. 4. Review signs and symptoms of emergent bleeding with patient. Use teach-back to ensure patient understanding. Advise patient to come to ED if signs or symptoms develop. 5. Encourage patient to call or contact with questions or concerns.  Cephus Shelling 07/19/2015, 4:15 PM

## 2015-07-20 LAB — GC/CHLAMYDIA PROBE AMP (~~LOC~~) NOT AT ARMC
CHLAMYDIA, DNA PROBE: NEGATIVE
Neisseria Gonorrhea: NEGATIVE

## 2015-07-20 LAB — HIV ANTIBODY (ROUTINE TESTING W REFLEX): HIV Screen 4th Generation wRfx: NONREACTIVE

## 2015-09-03 ENCOUNTER — Encounter (HOSPITAL_COMMUNITY): Payer: Self-pay | Admitting: Emergency Medicine

## 2015-09-03 ENCOUNTER — Emergency Department (HOSPITAL_COMMUNITY)
Admission: EM | Admit: 2015-09-03 | Discharge: 2015-09-03 | Disposition: A | Discharge status: Home / Self Care | Payer: Medicare Other | Attending: Emergency Medicine | Admitting: Emergency Medicine

## 2015-09-03 DIAGNOSIS — Z79899 Other long term (current) drug therapy: Secondary | ICD-10-CM | POA: Diagnosis not present

## 2015-09-03 DIAGNOSIS — F329 Major depressive disorder, single episode, unspecified: Secondary | ICD-10-CM | POA: Diagnosis not present

## 2015-09-03 DIAGNOSIS — K644 Residual hemorrhoidal skin tags: Secondary | ICD-10-CM | POA: Diagnosis not present

## 2015-09-03 DIAGNOSIS — K649 Unspecified hemorrhoids: Secondary | ICD-10-CM | POA: Diagnosis present

## 2015-09-03 DIAGNOSIS — D571 Sickle-cell disease without crisis: Secondary | ICD-10-CM | POA: Diagnosis not present

## 2015-09-03 DIAGNOSIS — F1210 Cannabis abuse, uncomplicated: Secondary | ICD-10-CM | POA: Insufficient documentation

## 2015-09-03 DIAGNOSIS — F17210 Nicotine dependence, cigarettes, uncomplicated: Secondary | ICD-10-CM | POA: Insufficient documentation

## 2015-09-03 DIAGNOSIS — F1410 Cocaine abuse, uncomplicated: Secondary | ICD-10-CM | POA: Diagnosis not present

## 2015-09-03 MED ORDER — POLYETHYLENE GLYCOL 3350 17 G PO PACK: 17.0000 g | PACK | Freq: Every day | ORAL | Status: DC

## 2015-09-03 MED ORDER — HYDROCORTISONE 2.5 % RE CREA: TOPICAL_CREAM | RECTAL | Status: AC

## 2015-09-03 NOTE — ED Notes (Signed)
Pa  at bedside. 

## 2015-09-03 NOTE — ED Provider Notes (Signed)
CSN: [id number]     Arrival date & time 09/03/15  1030 History   First MD Initiated Contact with Patient 09/03/15 1052     Chief Complaint  Patient presents with  . Hemorrhoids     (Consider location/radiation/quality/duration/timing/severity/associated sxs/prior Treatment) HPI   35 year old female with history of polysubstance abuse and history of hemorrhoidal presents requesting for evaluation of a hemorrhoid. Patient states several months ago she was in the ED for unrelated complaint but was told that she has a hemorrhoid. For the past several days she has had constipation and while trying to have a BM she "my hemorrhoid came out can you put it back". She reported mild itchiness and blood on the paper when wiping but denies any significant rectal pain. No abdominal pain. She denies taking narcotic pain medication. She has been soaking in a top, using laxatives without relief. She denies any recent rectal trauma. No fever and no discharge. She gave birth once via a cesarean section.  Past Medical History  Diagnosis Date  . Anemia   . Sickle cell anemia (HCC)     sickle cell trait  . Depression   . Anxiety   . Irregular menstrual bleeding   . Cocaine abuse   . Substance abuse    Past Surgical History  Procedure Laterality Date  . Cesarean section      FTP  . Foot surgery     Family History  Problem Relation Age of Onset  . Hypertension Mother    Social History  Substance Use Topics  . Smoking status: Current Some Day Smoker -- 0.50 packs/day    Types: Cigarettes    Last Attempt to Quit: 01/11/2011  . Smokeless tobacco: Never Used  . Alcohol Use: No     Comment: social   OB History    Gravida Para Term Preterm AB TAB SAB Ectopic Multiple Living   3 1 1  1  1   1      Review of Systems  Constitutional: Negative for fever.  Gastrointestinal: Positive for constipation, anal bleeding and rectal pain.      Allergies  Risperidone and related  Home Medications    Prior to Admission medications   Medication Sig Start Date End Date Taking? Authorizing Provider  Amantadine HCl 100 MG tablet Take 100 mg by mouth at bedtime.  05/25/15   Historical Provider, MD  benztropine (COGENTIN) 0.5 MG tablet Take 0.5 mg by mouth at bedtime.    Historical Provider, MD  citalopram (CELEXA) 40 MG tablet Take 40 mg by mouth at bedtime.    Historical Provider, MD  CRANBERRY PO Take 2 tablets by mouth daily.    Historical Provider, MD  docusate sodium (COLACE) 100 MG capsule Take 200 mg by mouth daily as needed for mild constipation.    Historical Provider, MD   BP 110/76 mmHg  Pulse 91  Temp(Src) 97.5 F (36.4 C) (Oral)  Resp 16  SpO2 98% Physical Exam  Constitutional: She appears well-developed and well-nourished. No distress.  Obese African-American female laying in the left lateral decubitus position in no acute distress  HENT:  Head: Atraumatic.  Eyes: Conjunctivae are normal.  Neck: Neck supple.  Abdominal: Soft. There is no tenderness.  Genitourinary:  Chaperone present during exam. External hemorrhoid noted at the 9:00 position, nonthrombosed and not actively bleeding. No anal fissure, no rectal prolapse, no obvious mass, normal rectal tone.  Neurological: She is alert.  Skin: No rash noted.  Psychiatric: She has a normal  mood and affect.  Nursing note and vitals reviewed.   ED Course  Procedures (including critical care time)   MDM   Final diagnoses:  External hemorrhoid    BP 114/53 mmHg  Pulse 83  Temp(Src) 97.5 F (36.4 C) (Oral)  Resp 18  SpO2 99%   11:14 AM Patient presents with evaluation of an external hemorrhoid. The hemorrhoids nonthrombosed. Will give Anusol, Miralax, recommend healthy diet, exercise, regular walking and decreased sitting to minimize combination for hemorrhoid. GI referral given as needed.   Fayrene Helper, PA-C 09/03/15 1121  Arby Barrette, MD 09/04/15 [phone]

## 2015-09-03 NOTE — Discharge Instructions (Signed)
Hemorrhoids °Hemorrhoids are swollen veins around the rectum or anus. There are two types of hemorrhoids:  °· Internal hemorrhoids. These occur in the veins just inside the rectum. They may poke through to the outside and become irritated and painful. °· External hemorrhoids. These occur in the veins outside the anus and can be felt as a painful swelling or hard lump near the anus. °CAUSES °· Pregnancy.   °· Obesity.   °· Constipation or diarrhea.   °· Straining to have a bowel movement.   °· Sitting for long periods on the toilet. °· Heavy lifting or other activity that caused you to strain. °· Anal intercourse. °SYMPTOMS  °· Pain.   °· Anal itching or irritation.   °· Rectal bleeding.   °· Fecal leakage.   °· Anal swelling.   °· One or more lumps around the anus.   °DIAGNOSIS  °Your caregiver may be able to diagnose hemorrhoids by visual examination. Other examinations or tests that may be performed include:  °· Examination of the rectal area with a gloved hand (digital rectal exam).   °· Examination of anal canal using a small tube (scope).   °· A blood test if you have lost a significant amount of blood. °· A test to look inside the colon (sigmoidoscopy or colonoscopy). °TREATMENT °Most hemorrhoids can be treated at home. However, if symptoms do not seem to be getting better or if you have a lot of rectal bleeding, your caregiver may perform a procedure to help make the hemorrhoids get smaller or remove them completely. Possible treatments include:  °· Placing a rubber band at the base of the hemorrhoid to cut off the circulation (rubber band ligation).   °· Injecting a chemical to shrink the hemorrhoid (sclerotherapy).   °· Using a tool to burn the hemorrhoid (infrared light therapy).   °· Surgically removing the hemorrhoid (hemorrhoidectomy).   °· Stapling the hemorrhoid to block blood flow to the tissue (hemorrhoid stapling).   °HOME CARE INSTRUCTIONS  °· Eat foods with fiber, such as whole grains, beans,  nuts, fruits, and vegetables. Ask your doctor about taking products with added fiber in them (fiber supplements). °· Increase fluid intake. Drink enough water and fluids to keep your urine clear or pale yellow.   °· Exercise regularly.   °· Go to the bathroom when you have the urge to have a bowel movement. Do not wait.   °· Avoid straining to have bowel movements.   °· Keep the anal area dry and clean. Use wet toilet paper or moist towelettes after a bowel movement.   °· Medicated creams and suppositories may be used or applied as directed.   °· Only take over-the-counter or prescription medicines as directed by your caregiver.   °· Take warm sitz baths for 15-20 minutes, 3-4 times a day to ease pain and discomfort.   °· Place ice packs on the hemorrhoids if they are tender and swollen. Using ice packs between sitz baths may be helpful.   °¨ Put ice in a plastic bag.   °¨ Place a towel between your skin and the bag.   °¨ Leave the ice on for 15-20 minutes, 3-4 times a day.   °· Do not use a donut-shaped pillow or sit on the toilet for long periods. This increases blood pooling and pain.   °SEEK MEDICAL CARE IF: °· You have increasing pain and swelling that is not controlled by treatment or medicine. °· You have uncontrolled bleeding. °· You have difficulty or you are unable to have a bowel movement. °· You have pain or inflammation outside the area of the hemorrhoids. °MAKE SURE YOU: °· Understand these instructions. °·   Will watch your condition. °· Will get help right away if you are not doing well or get worse. °  °This information is not intended to replace advice given to you by your health care provider. Make sure you discuss any questions you have with your health care provider. °  °Document Released: 04/27/2000 Document Revised: 04/16/2012 Document Reviewed: 03/04/2012 °Elsevier Interactive Patient Education ©2016 [employer]. ° °High-Fiber Diet °Fiber, also called dietary fiber, is a type of carbohydrate  found in fruits, vegetables, whole grains, and beans. A high-fiber diet can have many health benefits. Your health care provider may recommend a high-fiber diet to help: °· Prevent constipation. Fiber can make your bowel movements more regular. °· Lower your cholesterol. °· Relieve hemorrhoids, uncomplicated diverticulosis, or irritable bowel syndrome. °· Prevent overeating as part of a weight-loss plan. °· Prevent heart disease, type 2 diabetes, and certain cancers. °WHAT IS MY PLAN? °The recommended daily intake of fiber includes: °· 38 grams for men under age 50. °· 30 grams for men over age 50. °· 25 grams for women under age 50. °· 21 grams for women over age 50. °You can get the recommended daily intake of dietary fiber by eating a variety of fruits, vegetables, grains, and beans. Your health care provider may also recommend a fiber supplement if it is not possible to get enough fiber through your diet. °WHAT DO I NEED TO KNOW ABOUT A HIGH-FIBER DIET? °· Fiber supplements have not been widely studied for their effectiveness, so it is better to get fiber through food sources. °· Always check the fiber content on the nutrition facts label of any prepackaged food. Look for foods that contain at least 5 grams of fiber per serving. °· Ask your dietitian if you have questions about specific foods that are related to your condition, especially if those foods are not listed in the following section. °· Increase your daily fiber consumption gradually. Increasing your intake of dietary fiber too quickly may cause bloating, cramping, or gas. °· Drink plenty of water. Water helps you to digest fiber. °WHAT FOODS CAN I EAT? °Grains °Whole-grain breads. Multigrain cereal. Oats and oatmeal. Brown rice. Barley. Bulgur wheat. Millet. Bran muffins. Popcorn. Rye wafer crackers. °Vegetables °Sweet potatoes. Spinach. Kale. Artichokes. Cabbage. Broccoli. Green peas. Carrots. Squash. °Fruits °Berries. Pears. Apples. Oranges.  Avocados. Prunes and raisins. Dried figs. °Meats and Other Protein Sources °Navy, kidney, pinto, and soy beans. Split peas. Lentils. Nuts and seeds. °Dairy °Fiber-fortified yogurt. °Beverages °Fiber-fortified soy milk. Fiber-fortified orange juice. °Other °Fiber bars. °The items listed above may not be a complete list of recommended foods or beverages. Contact your dietitian for more options. °WHAT FOODS ARE NOT RECOMMENDED? °Grains °White bread. Pasta made with refined flour. White rice. °Vegetables °Fried potatoes. Canned vegetables. Well-cooked vegetables.  °Fruits °Fruit juice. Cooked, strained fruit. °Meats and Other Protein Sources °Fatty cuts of meat. Fried poultry or fried fish. °Dairy °Milk. Yogurt. Cream cheese. Sour cream. °Beverages °Soft drinks. °Other °Cakes and pastries. Butter and oils. °The items listed above may not be a complete list of foods and beverages to avoid. Contact your dietitian for more information. °WHAT ARE SOME TIPS FOR INCLUDING HIGH-FIBER FOODS IN MY DIET? °· Eat a wide variety of high-fiber foods. °· Make sure that half of all grains consumed each day are whole grains. °· Replace breads and cereals made from refined flour or white flour with whole-grain breads and cereals. °· Replace white rice with brown rice, bulgur wheat, or millet. °· Start   the day with a breakfast that is high in fiber, such as a cereal that contains at least 5 grams of fiber per serving. °· Use beans in place of meat in soups, salads, or pasta. °· Eat high-fiber snacks, such as berries, raw vegetables, nuts, or popcorn. °  °This information is not intended to replace advice given to you by your health care provider. Make sure you discuss any questions you have with your health care provider. °  °Document Released: 04/30/2005 Document Revised: 05/21/2014 Document Reviewed: 10/13/2013 °Elsevier Interactive Patient Education ©2016 [employer] Inc. ° °

## 2015-09-03 NOTE — ED Notes (Signed)
Pt has hx of hemorrhoids. Pt was trying to have a BM on Sunday and hemorrhoid came out. LBM yesterday. Pt also wants to see if she can get a prescription to help her BMs be more regular.

## 2015-10-02 ENCOUNTER — Emergency Department (HOSPITAL_COMMUNITY)
Admission: EM | Admit: 2015-10-02 | Discharge: 2015-10-02 | Disposition: A | Discharge status: Home / Self Care | Payer: Medicare Other | Attending: Emergency Medicine | Admitting: Emergency Medicine

## 2015-10-02 ENCOUNTER — Encounter (HOSPITAL_COMMUNITY): Payer: Self-pay

## 2015-10-02 DIAGNOSIS — J329 Chronic sinusitis, unspecified: Secondary | ICD-10-CM | POA: Diagnosis not present

## 2015-10-02 DIAGNOSIS — H109 Unspecified conjunctivitis: Secondary | ICD-10-CM | POA: Diagnosis not present

## 2015-10-02 DIAGNOSIS — F17210 Nicotine dependence, cigarettes, uncomplicated: Secondary | ICD-10-CM | POA: Diagnosis not present

## 2015-10-02 DIAGNOSIS — F329 Major depressive disorder, single episode, unspecified: Secondary | ICD-10-CM | POA: Diagnosis not present

## 2015-10-02 DIAGNOSIS — Z79899 Other long term (current) drug therapy: Secondary | ICD-10-CM | POA: Insufficient documentation

## 2015-10-02 DIAGNOSIS — J069 Acute upper respiratory infection, unspecified: Secondary | ICD-10-CM | POA: Insufficient documentation

## 2015-10-02 DIAGNOSIS — R51 Headache: Secondary | ICD-10-CM | POA: Diagnosis present

## 2015-10-02 MED ORDER — CIPROFLOXACIN HCL 0.3 % OP OINT: TOPICAL_OINTMENT | OPHTHALMIC | Status: DC

## 2015-10-02 MED ORDER — TETRACAINE HCL 0.5 % OP SOLN
1.0000 [drp] | Freq: Once | OPHTHALMIC | Status: AC
  Administered 2015-10-02: 1 [drp] via OPHTHALMIC
  Filled 2015-10-02: qty 4

## 2015-10-02 MED ORDER — DOXYCYCLINE HYCLATE 100 MG PO CAPS: 100.0000 mg | ORAL_CAPSULE | Freq: Two times a day (BID) | ORAL | Status: DC

## 2015-10-02 MED ORDER — FLUORESCEIN SODIUM 1 MG OP STRP
1.0000 | ORAL_STRIP | Freq: Once | OPHTHALMIC | Status: AC
  Administered 2015-10-02: 1 via OPHTHALMIC
  Filled 2015-10-02: qty 1

## 2015-10-02 NOTE — ED Provider Notes (Addendum)
CSN: [id number]     Arrival date & time 10/02/15  1109 History   First MD Initiated Contact with Patient 10/02/15 1300     Chief Complaint  Patient presents with  . Headache     (Consider location/radiation/quality/duration/timing/severity/associated sxs/prior Treatment) HPI 35 year old female who presents with headache. She has history of polysubstance. States that she has had 4 days of bilateral eye injection with increased watering. This is associated with sinus problem, with runny nose, eyes irritation, phlegm, sore throat, otalgia. No cough. Feels hot and cold. No nausea or vomiting. No known sick contacts. States intermittent headaches with this, but currently without headache. Reports associating sinus pressure. Notes sick contacts a few weeks ago, and these symptoms have been present over several weeks now, waxing and waning and not improving.     Past Medical History  Diagnosis Date  . Anemia   . Sickle cell anemia (HCC)     sickle cell trait  . Depression   . Anxiety   . Irregular menstrual bleeding   . Cocaine abuse   . Substance abuse    Past Surgical History  Procedure Laterality Date  . Cesarean section      FTP  . Foot surgery     Family History  Problem Relation Age of Onset  . Hypertension Mother    Social History  Substance Use Topics  . Smoking status: Current Some Day Smoker -- 0.50 packs/day    Types: Cigarettes    Last Attempt to Quit: 01/11/2011  . Smokeless tobacco: Never Used  . Alcohol Use: No     Comment: social   OB History    Gravida Para Term Preterm AB TAB SAB Ectopic Multiple Living   3 1 1  1  1   1      Review of Systems 10/14 systems reviewed and are negative other than those stated in the HPI    Allergies  Risperidone and related  Home Medications   Prior to Admission medications   Medication Sig Start Date End Date Taking? Authorizing Provider  ciprofloxacin (CILOXAN) 0.3 % ophthalmic ointment Apply a 1/2 inch ribbon  into the lower eyelid sac of the right eye 3 times/day for the first 2 days, followed by a 1/2 inch ribbon applied twice daily for the next 5 days 10/02/15   Lavera Guiseana Duo Kandi Brusseau, MD  doxycycline (VIBRAMYCIN) 100 MG capsule Take 1 capsule (100 mg total) by mouth 2 (two) times daily. 10/02/15   Lavera Guiseana Duo Delsin Copen, MD  polyethylene glycol [facility](MIRALAX / Ethelene HalGLYCOLAX) packet Take 17 g by mouth daily. Patient not taking: Reported on 10/02/2015 09/03/15   Fayrene HelperBowie Tran, PA-C   BP 142/85 mmHg  Pulse 62  Temp(Src) 98.3 F (36.8 C) (Oral)  Resp 18  Ht 5\' 6"  (1.676 m)  Wt 270 lb (122.471 kg)  BMI 43.60 kg/m2  SpO2 99%  LMP  Physical Exam Physical Exam  Nursing note and vitals reviewed. Constitutional: Well developed, well nourished, non-toxic, and in no acute distress Head: Normocephalic and atraumatic.  Mouth/Throat: Oropharynx is clear and moist.  Eyes: bilateral conjunctival injection right worse than left, with clear drainage. PERRL. EOMI. No periorbital swelling or erythema. Under fluorescein exam, there are no abrasions or ulcers.  Slit lamp exam with cell & flare.  Neck: Normal range of motion. Neck supple.  Cardiovascular: Normal rate and regular rhythm.   Pulmonary/Chest: Effort normal and breath sounds normal.  Abdominal: Soft. There is no tenderness. There is no rebound and no  guarding.  Musculoskeletal: Normal range of motion.  Neurological: Alert, no facial droop, fluent speech, moves all extremities symmetrically Skin: Skin is warm and dry.  Psychiatric: Cooperative  ED Course  Procedures (including critical care time) Labs Review Labs Reviewed - No data to display  Imaging Review No results found. I have personally reviewed and evaluated these images and lab results as part of my medical decision-making.   EKG Interpretation None      MDM   Final diagnoses:  Viral upper respiratory infection  Bilateral conjunctivitis  Sinusitis, unspecified chronicity, unspecified location     35 year old female who presents with eye redness, congestion, runny nose, and sinus pressure. Well-appearing no acute distress with normal vital signs. Suspecting viral sinusitis or other viral respiratory process. Does wear contacts, and has significant conjunctival injection over her right eye compared to the left. Without corneal abrasion or corneal ulcer in the setting of contact lens wearing. Suspect that this is a viral conjunctivitis in the setting of her other respiratory illness. NO eye pain, vision change, or headache currently. She expressed concern that this has been ongoing for the past few weeks, and I feel that it is reasonable to try  a course of antibiotics for sinusitis if she's been having these symptoms for this period of time. Will d/c with trial of doxycycline. Strict return and follow-up instructions reviewed. She expressed understanding of all discharge instructions and felt comfortable with the plan of care.     Lavera Guise, MD 10/02/15 1517  Lavera Guise, MD 10/02/15 [phone]

## 2015-10-02 NOTE — ED Notes (Signed)
Pt c/o of HA for the past "couple of weeks". Pt unable to rate pain. Pt reports hx of HA. Pt denies dizziness. Pt denies taking any OTC for HA. Pt A+OX4, speaking in complete sentences, and ambulatory w/ normal gait.

## 2015-10-02 NOTE — ED Notes (Addendum)
I ambulated patient to the triage visual acuity. The patient looked with her left eye, stated she could "only see the E"Patient then looked with her right eye, patient said "she couldn't see at all" Patient looked with both eyes, stated "she couldn't see anything, not even the big E"

## 2015-10-02 NOTE — Discharge Instructions (Signed)
You likely have a viral infection causing your symptoms. These usually take time to get better on their own. However, since you have been having these symptoms of sinusitis for more than a few weeks, it will be worth trying a course of antibiotics. Return without fail for worsening symptoms, including fever, worsening pain, new vision changes, or any other symptoms concerning to you. Do not wear contacts until your symptoms improve.   Sinusitis, Adult Sinusitis is redness, soreness, and inflammation of the paranasal sinuses. Paranasal sinuses are air pockets within the bones of your face. They are located beneath your eyes, in the middle of your forehead, and above your eyes. In healthy paranasal sinuses, mucus is able to drain out, and air is able to circulate through them by way of your nose. However, when your paranasal sinuses are inflamed, mucus and air can become trapped. This can allow bacteria and other germs to grow and cause infection. Sinusitis can develop quickly and last only a short time (acute) or continue over a long period (chronic). Sinusitis that lasts for more than 12 weeks is considered chronic. CAUSES Causes of sinusitis include:  Allergies.  Structural abnormalities, such as displacement of the cartilage that separates your nostrils (deviated septum), which can decrease the air flow through your nose and sinuses and affect sinus drainage.  Functional abnormalities, such as when the small hairs (cilia) that line your sinuses and help remove mucus do not work properly or are not present. SIGNS AND SYMPTOMS Symptoms of acute and chronic sinusitis are the same. The primary symptoms are pain and pressure around the affected sinuses. Other symptoms include:  Upper toothache.  Earache.  Headache.  Bad breath.  Decreased sense of smell and taste.  A cough, which worsens when you are lying flat.  Fatigue.  Fever.  Thick drainage from your nose, which often is green and  may contain pus (purulent).  Swelling and warmth over the affected sinuses. DIAGNOSIS Your health care provider will perform a physical exam. During your exam, your health care provider may perform any of the following to help determine if you have acute sinusitis or chronic sinusitis:  Look in your nose for signs of abnormal growths in your nostrils (nasal polyps).  Tap over the affected sinus to check for signs of infection.  View the inside of your sinuses using an imaging device that has a light attached (endoscope). If your health care provider suspects that you have chronic sinusitis, one or more of the following tests may be recommended:  Allergy tests.  Nasal culture. A sample of mucus is taken from your nose, sent to a lab, and screened for bacteria.  Nasal cytology. A sample of mucus is taken from your nose and examined by your health care provider to determine if your sinusitis is related to an allergy. TREATMENT Most cases of acute sinusitis are related to a viral infection and will resolve on their own within 10 days. Sometimes, medicines are prescribed to help relieve symptoms of both acute and chronic sinusitis. These may include pain medicines, decongestants, nasal steroid sprays, or saline sprays. However, for sinusitis related to a bacterial infection, your health care provider will prescribe antibiotic medicines. These are medicines that will help kill the bacteria causing the infection. Rarely, sinusitis is caused by a fungal infection. In these cases, your health care provider will prescribe antifungal medicine. For some cases of chronic sinusitis, surgery is needed. Generally, these are cases in which sinusitis recurs more than 3 times  per year, despite other treatments. HOME CARE INSTRUCTIONS  Drink plenty of water. Water helps thin the mucus so your sinuses can drain more easily.  Use a humidifier.  Inhale steam 3-4 times a day (for example, sit in the bathroom  with the shower running).  Apply a warm, moist washcloth to your face 3-4 times a day, or as directed by your health care provider.  Use saline nasal sprays to help moisten and clean your sinuses.  Take medicines only as directed by your health care provider.  If you were prescribed either an antibiotic or antifungal medicine, finish it all even if you start to feel better. SEEK IMMEDIATE MEDICAL CARE IF:  You have increasing pain or severe headaches.  You have nausea, vomiting, or drowsiness.  You have swelling around your face.  You have vision problems.  You have a stiff neck.  You have difficulty breathing.   This information is not intended to replace advice given to you by your health care provider. Make sure you discuss any questions you have with your health care provider.   Document Released: 04/30/2005 Document Revised: 05/21/2014 Document Reviewed: 05/15/2011 Elsevier Interactive Patient Education 2016 Else[employer] Inc.  Viral Infections A viral infection can be caused by different types of viruses.Most viral infections are not serious and resolve on their own. However, some infections may cause severe symptoms and may lead to further complications. SYMPTOMS Viruses can frequently cause:  Minor sore throat.  Aches and pains.  Headaches.  Runny nose.  Different types of rashes.  Watery eyes.  Tiredness.  Cough.  Loss of appetite.  Gastrointestinal infections, resulting in nausea, vomiting, and diarrhea. These symptoms do not respond to antibiotics because the infection is not caused by bacteria. However, you might catch a bacterial infection following the viral infection. This is sometimes called a "superinfection." Symptoms of such a bacterial infection may include:  Worsening sore throat with pus and difficulty swallowing.  Swollen neck glands.  Chills and a high or persistent fever.  Severe headache.  Tenderness over the sinuses.  Persistent  overall ill feeling (malaise), muscle aches, and tiredness (fatigue).  Persistent cough.  Yellow, green, or brown mucus production with coughing. HOME CARE INSTRUCTIONS   Only take over-the-counter or prescription medicines for pain, discomfort, diarrhea, or fever as directed by your caregiver.  Drink enough water and fluids to keep your urine clear or pale yellow. Sports drinks can provide valuable electrolytes, sugars, and hydration.  Get plenty of rest and maintain proper nutrition. Soups and broths with crackers or rice are fine. SEEK IMMEDIATE MEDICAL CARE IF:   You have severe headaches, shortness of breath, chest pain, neck pain, or an unusual rash.  You have uncontrolled vomiting, diarrhea, or you are unable to keep down fluids.  You or your child has an oral temperature above 102 F (38.9 C), not controlled by medicine.  Your baby is older than 3 months with a rectal temperature of 102 F (38.9 C) or higher.  Your baby is 8 months old or younger with a rectal temperature of 100.4 F (38 C) or higher. MAKE SURE YOU:   Understand these instructions.  Will watch your condition.  Will get help right away if you are not doing well or get worse.   This information is not intended to replace advice given to you by your health care provider. Make sure you discuss any questions you have with your health care provider.   Document Released: 02/07/2005 Document Revised: 07/23/2011  Document Reviewed: 10/06/2014 Elsevier Interactive Patient Education [employer]

## 2015-10-14 ENCOUNTER — Emergency Department (HOSPITAL_COMMUNITY)
Admission: EM | Admit: 2015-10-14 | Discharge: 2015-10-14 | Disposition: A | Discharge status: Home / Self Care | Payer: Medicare Other | Attending: Emergency Medicine | Admitting: Emergency Medicine

## 2015-10-14 ENCOUNTER — Encounter (HOSPITAL_COMMUNITY): Payer: Self-pay | Admitting: Emergency Medicine

## 2015-10-14 DIAGNOSIS — R109 Unspecified abdominal pain: Secondary | ICD-10-CM | POA: Diagnosis present

## 2015-10-14 DIAGNOSIS — F329 Major depressive disorder, single episode, unspecified: Secondary | ICD-10-CM | POA: Insufficient documentation

## 2015-10-14 DIAGNOSIS — Z3200 Encounter for pregnancy test, result unknown: Secondary | ICD-10-CM | POA: Diagnosis not present

## 2015-10-14 DIAGNOSIS — N898 Other specified noninflammatory disorders of vagina: Secondary | ICD-10-CM | POA: Insufficient documentation

## 2015-10-14 DIAGNOSIS — F17210 Nicotine dependence, cigarettes, uncomplicated: Secondary | ICD-10-CM | POA: Diagnosis not present

## 2015-10-14 DIAGNOSIS — N72 Inflammatory disease of cervix uteri: Secondary | ICD-10-CM | POA: Diagnosis not present

## 2015-10-14 LAB — URINALYSIS, ROUTINE W REFLEX MICROSCOPIC
BILIRUBIN URINE: NEGATIVE
GLUCOSE, UA: NEGATIVE mg/dL
Hgb urine dipstick: NEGATIVE
Ketones, ur: NEGATIVE mg/dL
NITRITE: NEGATIVE
PH: 5.5 (ref 5.0–8.0)
Protein, ur: NEGATIVE mg/dL
SPECIFIC GRAVITY, URINE: 1.019 (ref 1.005–1.030)

## 2015-10-14 LAB — URINE MICROSCOPIC-ADD ON

## 2015-10-14 LAB — WET PREP, GENITAL
Clue Cells Wet Prep HPF POC: NONE SEEN
Sperm: NONE SEEN
Trich, Wet Prep: NONE SEEN
Yeast Wet Prep HPF POC: NONE SEEN

## 2015-10-14 LAB — I-STAT BETA HCG BLOOD, ED (MC, WL, AP ONLY)

## 2015-10-14 MED ORDER — AZITHROMYCIN 250 MG PO TABS
1000.0000 mg | ORAL_TABLET | Freq: Once | ORAL | Status: AC
  Administered 2015-10-14: 1000 mg via ORAL
  Filled 2015-10-14: qty 4

## 2015-10-14 MED ORDER — LIDOCAINE HCL 2 % IJ SOLN
INTRAMUSCULAR | Status: AC
  Filled 2015-10-14: qty 20

## 2015-10-14 MED ORDER — CEFTRIAXONE SODIUM 250 MG IJ SOLR
250.0000 mg | Freq: Once | INTRAMUSCULAR | Status: AC
  Administered 2015-10-14: 250 mg via INTRAMUSCULAR
  Filled 2015-10-14: qty 250

## 2015-10-14 NOTE — ED Notes (Signed)
Pt refused pelvic exam setup stating "last time I had this exam done I had a miscarriage, isn't there some other way you can figure this stuff out like with an ultrasound or something?"

## 2015-10-14 NOTE — ED Provider Notes (Signed)
CSN: [id number]     Arrival date & time 10/14/15  1831 History   First MD Initiated Contact with Patient 10/14/15 1939     Chief Complaint  Patient presents with  . Abdominal Pain  . wants pregnancy test   . wants STD testing   . Vaginal Discharge     (Consider location/radiation/quality/duration/timing/severity/associated sxs/prior Treatment) HPI Complains of lower abdominal discomfort, feeling of "bloating" and vaginal discharge onset 2 days ago. She reports her last normal menstrual period was August 2016... Last bowel movement yesterday. No other associated symptoms. No treatment prior to coming here. No nausea or vomiting. Symptoms constant. Nothing makes symptoms better or worse. Past Medical History  Diagnosis Date  . Anemia   . Sickle cell anemia (HCC)     sickle cell trait  . Depression   . Anxiety   . Irregular menstrual bleeding   . Cocaine abuse   . Substance abuse    Past Surgical History  Procedure Laterality Date  . Cesarean section      FTP  . Foot surgery     Family History  Problem Relation Age of Onset  . Hypertension Mother    Social History  Substance Use Topics  . Smoking status: Current Some Day Smoker -- 0.50 packs/day    Types: Cigarettes    Last Attempt to Quit: 01/11/2011  . Smokeless tobacco: Never Used  . Alcohol Use: No     Comment: social   OB History    Gravida Para Term Preterm AB TAB SAB Ectopic Multiple Living   Review of Systems  Constitutional: Negative.   HENT: Negative.   Respiratory: Negative.   Cardiovascular: Negative.   Gastrointestinal: Positive for abdominal pain.  Genitourinary: Positive for vaginal discharge.       Irregular menses  Musculoskeletal: Negative.   Skin: Negative.   Neurological: Negative.   Psychiatric/Behavioral: Negative.   All other systems reviewed and are negative.     Allergies  Risperidone and related  Home Medications   Prior to Admission medications    Medication Sig Start Date End Date Taking? Authorizing Provider  ciprofloxacin (CILOXAN) 0.3 % ophthalmic ointment Apply a 1/2 inch ribbon into the lower eyelid sac of the right eye 3 times/day for the first 2 days, followed by a 1/2 inch ribbon applied twice daily for the next 5 days 10/02/15   Lavera Guise, MD  doxycycline (VIBRAMYCIN) 100 MG capsule Take 1 capsule (100 mg total) by mouth 2 (two) times daily. 10/02/15   Lavera Guise, MD  polyethylene glycol [facility] Ethelene Hal) packet Take 17 g by mouth daily. Patient not taking: Reported on 10/02/2015 09/03/15   Fayrene Helper, PA-C   BP 113/84 mmHg  Pulse 75  Temp(Src) 98.5 F (36.9 C) (Oral)  Resp 16  SpO2 100%  LMP 12/14/2014 Physical Exam  Constitutional: She appears well-developed and well-nourished.  HENT:  Head: Normocephalic and atraumatic.  Eyes: Conjunctivae are normal. Pupils are equal, round, and reactive to light.  Neck: Neck supple. No tracheal deviation present. No thyromegaly present.  Cardiovascular: Normal rate and regular rhythm.   No murmur heard. Pulmonary/Chest: Effort normal and breath sounds normal.  Abdominal: Soft. Bowel sounds are normal. She exhibits no distension. There is no tenderness.  Genitourinary:  No external lesion brownish discharge in vaginal vault. Cervical os closed. Mild cervical motion tenderness. No adnexal masses or tenderness.  Musculoskeletal: Normal range  of motion. She exhibits no edema or tenderness.  Neurological: She is alert. Coordination normal.  Skin: Skin is warm and dry. No rash noted.  Psychiatric: She has a normal mood and affect.  Nursing note and vitals reviewed.   ED Course  Procedures (including critical care time) Labs Review Labs Reviewed  URINE CULTURE  URINALYSIS, ROUTINE W REFLEX MICROSCOPIC (NOT AT [facility])  POC URINE PREG, ED    Imaging Review No results found. I have personally reviewed and evaluated these images and lab results as part of my medical  decision-making.   EKG Interpretation None     I-STAT beta hCG less than 5 10:45 PM patient resting comfortably. Results for orders placed or performed during the hospital encounter of 10/14/15  Wet prep, genital  Result Value Ref Range   Yeast Wet Prep HPF POC NONE SEEN NONE SEEN   Trich, Wet Prep NONE SEEN NONE SEEN   Clue Cells Wet Prep HPF POC NONE SEEN NONE SEEN   WBC, Wet Prep HPF POC MODERATE (A) NONE SEEN   Sperm NONE SEEN   Urinalysis, Routine w reflex microscopic (not at [facility])  Result Value Ref Range   Color, Urine YELLOW YELLOW   APPearance CLOUDY (A) CLEAR   Specific Gravity, Urine 1.019 1.005 - 1.030   pH 5.5 5.0 - 8.0   Glucose, UA NEGATIVE NEGATIVE mg/dL   Hgb urine dipstick NEGATIVE NEGATIVE   Bilirubin Urine NEGATIVE NEGATIVE   Ketones, ur NEGATIVE NEGATIVE mg/dL   Protein, ur NEGATIVE NEGATIVE mg/dL   Nitrite NEGATIVE NEGATIVE   Leukocytes, UA SMALL (A) NEGATIVE  Urine microscopic-add on  Result Value Ref Range   Squamous Epithelial / LPF 6-30 (A) NONE SEEN   WBC, UA 6-30 0 - 5 WBC/hpf   RBC / HPF 0-5 0 - 5 RBC/hpf   Bacteria, UA RARE (A) NONE SEEN   Urine-Other MUCOUS PRESENT   I-Stat Beta hCG blood, ED ([facility], [facility], [facility] only)  Result Value Ref Range   I-stat hCG, quantitative <5.0 <5 mIU/mL   Comment 3           No results found.  MDM  We will treat empirically for sexual transmitted diseases. Rocephin. Zithromax. Safe sex encouraged. Referral to women's outpatient clinic Diagnosis #1 cervicitis #2 amenorrhea Final diagnoses:  None        Doug SouSam Kortny Lirette, MD 10/14/15 2248

## 2015-10-14 NOTE — Discharge Instructions (Signed)
Cervicitis Use a condom she time that you have sex in order to prevent sexually transmitted diseases. Call the women's outpatient clinic to arrange to get a gynecologist. Cervicitis is a soreness and swelling (inflammation) of the cervix. Your cervix is located at the bottom of your uterus. It opens up to the vagina. CAUSES   Sexually transmitted infections (STIs).   Allergic reaction.   Medicines or birth control devices that are put in the vagina.   Injury to the cervix.   Bacterial infections.  RISK FACTORS You are at greater risk if you:  Have unprotected sexual intercourse.  Have sexual intercourse with many partners.  Began sexual intercourse at an early age.  Have a history of STIs. SYMPTOMS  There may be no symptoms. If symptoms occur, they may include:   Gray, white, yellow, or bad-smelling vaginal discharge.   Pain or itching of the area outside the vagina.   Painful sexual intercourse.   Lower abdominal or lower back pain, especially during intercourse.   Frequent urination.   Abnormal vaginal bleeding between periods, after sexual intercourse, or after menopause.   Pressure or a heavy feeling in the pelvis.  DIAGNOSIS  Diagnosis is made after a pelvic exam. Other tests may include:   Examination of any discharge under a microscope (wet prep).   A Pap test.  TREATMENT  Treatment will depend on the cause of cervicitis. If it is caused by an STI, both you and your partner will need to be treated. Antibiotic medicines will be given.  HOME CARE INSTRUCTIONS   Do not have sexual intercourse until your health care provider says it is okay.   Do not have sexual intercourse until your partner has been treated, if your cervicitis is caused by an STI.   Take your antibiotics as directed. Finish them even if you start to feel better.  SEEK MEDICAL CARE IF:  Your symptoms come back.   You have a fever.  MAKE SURE YOU:   Understand these  instructions.  Will watch your condition.  Will get help right away if you are not doing well or get worse.   This information is not intended to replace advice given to you by your health care provider. Make sure you discuss any questions you have with your health care provider.   Document Released: 04/30/2005 Document Revised: 05/05/2013 Document Reviewed: 10/22/2012 Elsevier Interactive Patient Education [employer] Elsevier Inc.

## 2015-10-14 NOTE — ED Notes (Signed)
Per EMS patient from home c/o abd pain.  Reports to EMS that she has been having a lot of unprotected sex here lately, this morning had some rough sex.  Patient states that her periods have been irregular since December 2016.  Patient states that she wants pregnancy test and STD testing, esp since had brown discharge couple days ago.

## 2015-10-15 LAB — RPR: RPR: NONREACTIVE

## 2015-10-15 LAB — HIV ANTIBODY (ROUTINE TESTING W REFLEX): HIV SCREEN 4TH GENERATION: NONREACTIVE

## 2015-10-16 LAB — URINE CULTURE

## 2015-10-17 LAB — GC/CHLAMYDIA PROBE AMP (~~LOC~~) NOT AT ARMC
CHLAMYDIA, DNA PROBE: NEGATIVE
NEISSERIA GONORRHEA: NEGATIVE

## 2015-11-14 ENCOUNTER — Inpatient Hospital Stay (HOSPITAL_COMMUNITY)
Admission: AD | Admit: 2015-11-14 | Discharge: 2015-11-14 | Disposition: A | Discharge status: Home / Self Care | Payer: Medicare Other | Source: Ambulatory Visit | Attending: Obstetrics and Gynecology | Admitting: Obstetrics and Gynecology

## 2015-11-14 ENCOUNTER — Encounter (HOSPITAL_COMMUNITY): Payer: Self-pay

## 2015-11-14 DIAGNOSIS — N390 Urinary tract infection, site not specified: Secondary | ICD-10-CM | POA: Insufficient documentation

## 2015-11-14 DIAGNOSIS — N3000 Acute cystitis without hematuria: Secondary | ICD-10-CM | POA: Diagnosis not present

## 2015-11-14 DIAGNOSIS — N189 Chronic kidney disease, unspecified: Secondary | ICD-10-CM | POA: Insufficient documentation

## 2015-11-14 DIAGNOSIS — F17210 Nicotine dependence, cigarettes, uncomplicated: Secondary | ICD-10-CM | POA: Diagnosis not present

## 2015-11-14 DIAGNOSIS — R1011 Right upper quadrant pain: Secondary | ICD-10-CM | POA: Diagnosis present

## 2015-11-14 DIAGNOSIS — D573 Sickle-cell trait: Secondary | ICD-10-CM | POA: Diagnosis not present

## 2015-11-14 DIAGNOSIS — N289 Disorder of kidney and ureter, unspecified: Secondary | ICD-10-CM

## 2015-11-14 LAB — URINE MICROSCOPIC-ADD ON

## 2015-11-14 LAB — URINALYSIS, ROUTINE W REFLEX MICROSCOPIC
Glucose, UA: NEGATIVE mg/dL
Ketones, ur: 15 mg/dL — AB
Nitrite: NEGATIVE
Protein, ur: 100 mg/dL — AB
pH: 5 (ref 5.0–8.0)

## 2015-11-14 LAB — COMPREHENSIVE METABOLIC PANEL
ALBUMIN: 5.1 g/dL — AB (ref 3.5–5.0)
ALK PHOS: 63 U/L (ref 38–126)
ALT: 23 U/L (ref 14–54)
AST: 30 U/L (ref 15–41)
Anion gap: 11 (ref 5–15)
BILIRUBIN TOTAL: 1.1 mg/dL (ref 0.3–1.2)
BUN: 18 mg/dL (ref 6–20)
CALCIUM: 10.4 mg/dL — AB (ref 8.9–10.3)
CO2: 24 mmol/L (ref 22–32)
Chloride: 100 mmol/L — ABNORMAL LOW (ref 101–111)
Creatinine, Ser: 2.71 mg/dL — ABNORMAL HIGH (ref 0.44–1.00)
GFR calc Af Amer: 26 mL/min — ABNORMAL LOW (ref >60)
GFR calc non Af Amer: 22 mL/min — ABNORMAL LOW (ref >60)
GLUCOSE: 99 mg/dL (ref 65–99)
Potassium: 4.3 mmol/L (ref 3.5–5.1)
Sodium: 135 mmol/L (ref 135–145)
TOTAL PROTEIN: 10 g/dL — AB (ref 6.5–8.1)

## 2015-11-14 LAB — CBC
HEMATOCRIT: 45.6 % (ref 36.0–46.0)
HEMOGLOBIN: 16.3 g/dL — AB (ref 12.0–15.0)
MCH: 29.2 pg (ref 26.0–34.0)
MCHC: 35.7 g/dL (ref 30.0–36.0)
MCV: 81.6 fL (ref 78.0–100.0)
Platelets: 404 10*3/uL — ABNORMAL HIGH (ref 150–400)
RBC: 5.59 MIL/uL — ABNORMAL HIGH (ref 3.87–5.11)
RDW: 13.7 % (ref 11.5–15.5)
WBC: 8.9 10*3/uL (ref 4.0–10.5)

## 2015-11-14 LAB — POCT PREGNANCY, URINE: PREG TEST UR: NEGATIVE

## 2015-11-14 LAB — AMYLASE: Amylase: 70 U/L (ref 28–100)

## 2015-11-14 LAB — LIPASE, BLOOD: Lipase: 26 U/L (ref 11–51)

## 2015-11-14 MED ORDER — GI COCKTAIL ~~LOC~~
30.0000 mL | Freq: Once | ORAL | Status: AC
  Administered 2015-11-14: 30 mL via ORAL
  Filled 2015-11-14: qty 30

## 2015-11-14 MED ORDER — CIPROFLOXACIN HCL 250 MG PO TABS: 250.0000 mg | ORAL_TABLET | Freq: Two times a day (BID) | ORAL | Status: DC

## 2015-11-14 NOTE — Discharge Instructions (Signed)
Chronic Kidney Disease Chronic kidney disease happens when the kidneys are damaged over a long period. The kidneys are two organs that do many important jobs in the body. These jobs include:  Removing wastes and extra fluids from the blood.  Making hormones that help to keep the body healthy.  Making sure that the body has the right amount of fluids and chemicals. Chronic kidney disease may be caused by many things. The kidney damage occurs slowly. If too much damage occurs, the kidneys may stop working the way that they should. This is dangerous. Treatment can help to slow down the damage and keep it from getting worse. HOME CARE  Follow your diet as told by your doctor. You may need to limit the amount of salt (sodium) and protein that you eat each day.  Take medicines only as told by your doctor. Do not take any new medicines unless your doctor approves it.  Quit smoking if you smoke. Talk to your doctor about programs that may help you quit smoking.  Have your blood pressure checked regularly and keep track of the results.  Start or keep doing an exercise plan.  Get shots (immunizations) as told by your doctor.  Take vitamins and minerals as told by your doctor.  Keep all follow-up visits as told by your doctor. This is important. GET HELP RIGHT AWAY IF:   Your symptoms get worse.  You have new symptoms.  You have symptoms of end-stage kidney disease. These include:  Headaches.  Skin that is darker or lighter than normal.  Numbness in the hands or feet.  Easy bruising.  Frequent hiccups.  Stopping of menstrual periods in women.  You have a fever.  You are making very little pee (urine).  You have pain or bleeding when you pee.   This information is not intended to replace advice given to you by your health care provider. Make sure you discuss any questions you have with your health care provider.   Document Released: 07/25/2009 Document Revised: 01/19/2015  Document Reviewed: 12/28/2011 Elsevier Interactive Patient Education 2016 Elsevier Inc. Urinary Tract Infection A urinary tract infection (UTI) can occur any place along the urinary tract. The tract includes the kidneys, ureters, bladder, and urethra. A type of germ called bacteria often causes a UTI. UTIs are often helped with antibiotic medicine.  HOME CARE   If given, take antibiotics as told by your doctor. Finish them even if you start to feel better.  Drink enough fluids to keep your pee (urine) clear or pale yellow.  Avoid tea, drinks with caffeine, and bubbly (carbonated) drinks.  Pee often. Avoid holding your pee in for a long time.  Pee before and after having sex (intercourse).  Wipe from front to back after you poop (bowel movement) if you are a woman. Use each tissue only once. GET HELP RIGHT AWAY IF:   You have back pain.  You have lower belly (abdominal) pain.  You have chills.  You feel sick to your stomach (nauseous).  You throw up (vomit).  Your burning or discomfort with peeing does not go away.  You have a fever.  Your symptoms are not better in 3 days. MAKE SURE YOU:   Understand these instructions.  Will watch your condition.  Will get help right away if you are not doing well or get worse.   This information is not intended to replace advice given to you by your health care provider. Make sure you discuss any questions  you have with your health care provider.   Document Released: 10/17/2007 Document Revised: 05/21/2014 Document Reviewed: 11/29/2011 Elsevier Interactive Patient Education [employer].

## 2015-11-14 NOTE — MAU Note (Signed)
Pt presents to MAU with complaints of pain under her breast. States that she is pregnant and has not had a cycle since September. Denies any vaginal bleeding

## 2015-11-14 NOTE — MAU Provider Note (Signed)
History     CSN: [id number]  Arrival date and time: 11/14/15 1527   First Provider Initiated Contact with Patient 11/14/15 2026      Chief Complaint  Patient presents with  . Breast Pain  . Possible Pregnancy   HPI [name] is a 35 y.o. [id number] who presents to MAU today with complaint of RUQ abdominal pain since earlier today. She states pain is much improved since onset. She did not take anything for pain. She denies pain now. She denies fever, flank pain, lower abdominal pain, N/V/D or vaginal discharge. She states that she may have some spotting. She is a poor historian and possibly has some diminished mental capacity based on our conversation. She denies a current PCP. Patient also states that she has been pregnant at least 15 times between her periods and would like to make sure that we fix the infection left over from those pregnancies.   OB History    Gravida Para Term Preterm AB TAB SAB Ectopic Multiple Living   Past Medical History  Diagnosis Date  . Anemia   . Sickle cell anemia (HCC)     sickle cell trait  . Depression   . Anxiety   . Irregular menstrual bleeding   . Cocaine abuse   . Substance abuse     Past Surgical History  Procedure Laterality Date  . Cesarean section      FTP  . Foot surgery      Family History  Problem Relation Age of Onset  . Hypertension Mother     Social History  Substance Use Topics  . Smoking status: Current Some Day Smoker -- 0.50 packs/day    Types: Cigarettes    Last Attempt to Quit: 01/11/2011  . Smokeless tobacco: Never Used  . Alcohol Use: No     Comment: social    Allergies:  Allergies  Allergen Reactions  . Other     Patient states that she is allergic to all prescription medications.  . Risperidone And Related Other (See Comments)    Excessive weight gain     No prescriptions prior to admission    Review of Systems  Constitutional: Negative for fever and malaise/fatigue.   Gastrointestinal: Positive for abdominal pain. Negative for nausea, vomiting, diarrhea and constipation.  Genitourinary: Negative for dysuria, urgency, frequency, hematuria and flank pain.       + spotting Neg - vaginal discharge   Physical Exam   Blood pressure 123/92, pulse 98, temperature 98.6 F (37 C), temperature source Oral, resp. rate 18, height  (1.702 m), weight 170 lb (77.111 kg), last menstrual period 01/15/2015.  Physical Exam  Nursing note and vitals reviewed. Constitutional: She is oriented to person, place, and time. She appears well-developed and well-nourished. No distress.  HENT:  Head: Normocephalic and atraumatic.  Cardiovascular: Normal rate.   Respiratory: Effort normal.  GI: Soft. She exhibits no distension and no mass. There is no tenderness. There is no rebound, no guarding and no CVA tenderness.  Neurological: She is alert and oriented to person, place, and time.  Skin: Skin is warm and dry. No erythema.  Psychiatric: She has a normal mood and affect.    Results for orders placed or performed during the hospital encounter of 11/14/15 (from the past 24 hour(s))  Pregnancy, urine POC     Status: None   Collection Time: 11/14/15  3:52 PM  Result Value Ref Range   Preg Test, Ur NEGATIVE NEGATIVE  CBC     Status: Abnormal   Collection Time: 11/14/15  7:01 PM  Result Value Ref Range   WBC 8.9 4.0 - 10.5 K/uL   RBC 5.59 (H) 3.87 - 5.11 MIL/uL   Hemoglobin 16.3 (H) 12.0 - 15.0 g/dL   HCT 69.645.6 29.536.0 - 28.446.0 %   MCV 81.6 78.0 - 100.0 fL   MCH 29.2 26.0 - 34.0 pg   MCHC 35.7 30.0 - 36.0 g/dL   RDW 13.213.7 44.011.5 - 10.215.5 %   Platelets 404 (H) 150 - 400 K/uL  Comprehensive metabolic panel     Status: Abnormal   Collection Time: 11/14/15  7:01 PM  Result Value Ref Range   Sodium 135 135 - 145 mmol/L   Potassium 4.3 3.5 - 5.1 mmol/L   Chloride 100 (L) 101 - 111 mmol/L   CO2 24 22 - 32 mmol/L   Glucose, Bld 99 65 - 99 mg/dL   BUN 18 6 - 20 mg/dL    Creatinine, Ser 7.252.71 (H) 0.44 - 1.00 mg/dL   Calcium 36.610.4 (H) 8.9 - 10.3 mg/dL   Total Protein 44.010.0 (H) 6.5 - 8.1 g/dL   Albumin 5.1 (H) 3.5 - 5.0 g/dL   AST 30 15 - 41 U/L   ALT 23 14 - 54 U/L   Alkaline Phosphatase 63 38 - 126 U/L   Total Bilirubin 1.1 0.3 - 1.2 mg/dL   GFR calc non Af Amer 22 (L) >60 mL/min   GFR calc Af Amer 26 (L) >60 mL/min   Anion gap 11 5 - 15  Amylase     Status: None   Collection Time: 11/14/15  7:01 PM  Result Value Ref Range   Amylase 70 28 - 100 U/L  Lipase, blood     Status: None   Collection Time: 11/14/15  7:01 PM  Result Value Ref Range   Lipase 26 11 - 51 U/L  Urinalysis, Routine w reflex microscopic (not at [facility])     Status: Abnormal   Collection Time: 11/14/15  7:20 PM  Result Value Ref Range   Color, Urine YELLOW YELLOW   APPearance CLOUDY (A) CLEAR   Specific Gravity, Urine >1.030 (H) 1.005 - 1.030   pH 5.0 5.0 - 8.0   Glucose, UA NEGATIVE NEGATIVE mg/dL   Hgb urine dipstick TRACE (A) NEGATIVE   Bilirubin Urine MODERATE (A) NEGATIVE   Ketones, ur 15 (A) NEGATIVE mg/dL   Protein, ur 347100 (A) NEGATIVE mg/dL   Nitrite NEGATIVE NEGATIVE   Leukocytes, UA TRACE (A) NEGATIVE  Urine microscopic-add on     Status: Abnormal   Collection Time: 11/14/15  7:20 PM  Result Value Ref Range   Squamous Epithelial / LPF 6-30 (A) NONE SEEN   WBC, UA 6-30 0 - 5 WBC/hpf   RBC / HPF 0-5 0 - 5 RBC/hpf   Bacteria, UA MANY (A) NONE SEEN   Casts HYALINE CASTS (A) NEGATIVE   Trichomonas, UA PRESENT    Urine-Other MUCOUS PRESENT     MAU Course  Procedures None  MDM UPT - negative UA, CBC, CMP, Amylase and Lipase today  Urine culture pending  Elevated serum creatinine and decreased GFR appears to be chronic, but worse today. Will refer to primary care for further management.  Patient advised of negative UPT. She is relieved.  Assessment and Plan  A: UTI, acute  Kidney dysfunction, chronic  P: Discharge home Rx for  Cipro given to patient  Warning  signs for worsening condition discussed Patient advised to follow-up with PCP of choice. Contact information for [facility] given to patient.  Patient may return to MAU as needed or if her condition were to change or worsen  Marny LowensteinJulie N Raquan Iannone, PA-C  11/14/2015, 9:00 PM

## 2015-11-15 ENCOUNTER — Encounter (HOSPITAL_COMMUNITY): Payer: Self-pay | Admitting: *Deleted

## 2015-11-15 ENCOUNTER — Observation Stay (HOSPITAL_COMMUNITY)
Admission: EM | Admit: 2015-11-15 | Discharge: 2015-11-16 | Disposition: A | Discharge status: Other Acute Inpatient Hospital | Payer: Medicare Other | Attending: Family Medicine | Admitting: Family Medicine

## 2015-11-15 ENCOUNTER — Observation Stay (HOSPITAL_COMMUNITY): Payer: Medicare Other

## 2015-11-15 DIAGNOSIS — F28 Other psychotic disorder not due to a substance or known physiological condition: Secondary | ICD-10-CM

## 2015-11-15 DIAGNOSIS — F29 Unspecified psychosis not due to a substance or known physiological condition: Secondary | ICD-10-CM | POA: Diagnosis present

## 2015-11-15 DIAGNOSIS — Z79899 Other long term (current) drug therapy: Secondary | ICD-10-CM | POA: Diagnosis not present

## 2015-11-15 DIAGNOSIS — F23 Brief psychotic disorder: Secondary | ICD-10-CM | POA: Diagnosis not present

## 2015-11-15 DIAGNOSIS — N390 Urinary tract infection, site not specified: Secondary | ICD-10-CM | POA: Diagnosis not present

## 2015-11-15 DIAGNOSIS — Z9114 Patient's other noncompliance with medication regimen: Secondary | ICD-10-CM | POA: Diagnosis not present

## 2015-11-15 DIAGNOSIS — F1910 Other psychoactive substance abuse, uncomplicated: Secondary | ICD-10-CM | POA: Diagnosis not present

## 2015-11-15 DIAGNOSIS — A599 Trichomoniasis, unspecified: Secondary | ICD-10-CM | POA: Insufficient documentation

## 2015-11-15 DIAGNOSIS — F19951 Other psychoactive substance use, unspecified with psychoactive substance-induced psychotic disorder with hallucinations: Secondary | ICD-10-CM | POA: Diagnosis present

## 2015-11-15 DIAGNOSIS — F3160 Bipolar disorder, current episode mixed, unspecified: Secondary | ICD-10-CM | POA: Diagnosis not present

## 2015-11-15 DIAGNOSIS — F323 Major depressive disorder, single episode, severe with psychotic features: Secondary | ICD-10-CM | POA: Insufficient documentation

## 2015-11-15 DIAGNOSIS — E876 Hypokalemia: Secondary | ICD-10-CM | POA: Insufficient documentation

## 2015-11-15 DIAGNOSIS — F17210 Nicotine dependence, cigarettes, uncomplicated: Secondary | ICD-10-CM | POA: Diagnosis not present

## 2015-11-15 DIAGNOSIS — F1414 Cocaine abuse with cocaine-induced mood disorder: Secondary | ICD-10-CM | POA: Diagnosis not present

## 2015-11-15 DIAGNOSIS — N179 Acute kidney failure, unspecified: Principal | ICD-10-CM | POA: Diagnosis present

## 2015-11-15 LAB — COMPREHENSIVE METABOLIC PANEL
ALBUMIN: 5.1 g/dL — AB (ref 3.5–5.0)
ALBUMIN: 4.5 g/dL (ref 3.5–5.0)
ALK PHOS: 52 U/L (ref 38–126)
ALT: 23 U/L (ref 14–54)
ALT: 20 U/L (ref 14–54)
ANION GAP: 12 (ref 5–15)
AST: 35 U/L (ref 15–41)
AST: 30 U/L (ref 15–41)
Alkaline Phosphatase: 61 U/L (ref 38–126)
Anion gap: 13 (ref 5–15)
BILIRUBIN TOTAL: 1.1 mg/dL (ref 0.3–1.2)
BILIRUBIN TOTAL: 1 mg/dL (ref 0.3–1.2)
BUN: 19 mg/dL (ref 6–20)
BUN: 20 mg/dL (ref 6–20)
CALCIUM: 9.5 mg/dL (ref 8.9–10.3)
CHLORIDE: 100 mmol/L — AB (ref 101–111)
CO2: 22 mmol/L (ref 22–32)
CO2: 20 mmol/L — ABNORMAL LOW (ref 22–32)
CREATININE: 2.95 mg/dL — AB (ref 0.44–1.00)
Calcium: 10.1 mg/dL (ref 8.9–10.3)
Chloride: 103 mmol/L (ref 101–111)
Creatinine, Ser: 3.46 mg/dL — ABNORMAL HIGH (ref 0.44–1.00)
GFR calc Af Amer: 19 mL/min — ABNORMAL LOW (ref >60)
GFR calc Af Amer: 23 mL/min — ABNORMAL LOW (ref >60)
GFR calc non Af Amer: 16 mL/min — ABNORMAL LOW (ref >60)
GFR calc non Af Amer: 20 mL/min — ABNORMAL LOW (ref >60)
GLUCOSE: 104 mg/dL — AB (ref 65–99)
GLUCOSE: 106 mg/dL — AB (ref 65–99)
POTASSIUM: 4 mmol/L (ref 3.5–5.1)
Potassium: 3.5 mmol/L (ref 3.5–5.1)
SODIUM: 135 mmol/L (ref 135–145)
Sodium: 135 mmol/L (ref 135–145)
TOTAL PROTEIN: 10.2 g/dL — AB (ref 6.5–8.1)
TOTAL PROTEIN: 8.8 g/dL — AB (ref 6.5–8.1)

## 2015-11-15 LAB — RAPID URINE DRUG SCREEN, HOSP PERFORMED
Amphetamines: NOT DETECTED
BARBITURATES: POSITIVE — AB
Benzodiazepines: NOT DETECTED
COCAINE: POSITIVE — AB
Opiates: NOT DETECTED
TETRAHYDROCANNABINOL: NOT DETECTED

## 2015-11-15 LAB — CBC WITH DIFFERENTIAL/PLATELET
BASOS ABS: 0 10*3/uL (ref 0.0–0.1)
Basophils Absolute: 0 10*3/uL (ref 0.0–0.1)
Basophils Relative: 0 %
Basophils Relative: 0 %
EOS ABS: 0.1 10*3/uL (ref 0.0–0.7)
Eosinophils Absolute: 0 10*3/uL (ref 0.0–0.7)
Eosinophils Relative: 0 %
Eosinophils Relative: 1 %
HCT: 43.1 % (ref 36.0–46.0)
HEMATOCRIT: 45.6 % (ref 36.0–46.0)
HEMOGLOBIN: 16 g/dL — AB (ref 12.0–15.0)
HEMOGLOBIN: 15.5 g/dL — AB (ref 12.0–15.0)
LYMPHS ABS: 2.7 10*3/uL (ref 0.7–4.0)
LYMPHS ABS: 3.5 10*3/uL (ref 0.7–4.0)
LYMPHS PCT: 28 %
LYMPHS PCT: 36 %
MCH: 28.8 pg (ref 26.0–34.0)
MCH: 28.8 pg (ref 26.0–34.0)
MCHC: 35.1 g/dL (ref 30.0–36.0)
MCHC: 36 g/dL (ref 30.0–36.0)
MCV: 82 fL (ref 78.0–100.0)
MCV: 80.1 fL (ref 78.0–100.0)
MONO ABS: 1 10*3/uL (ref 0.1–1.0)
MONO ABS: 1.1 10*3/uL — AB (ref 0.1–1.0)
MONOS PCT: 10 %
Monocytes Relative: 11 %
NEUTROS ABS: 6 10*3/uL (ref 1.7–7.7)
NEUTROS ABS: 5.1 10*3/uL (ref 1.7–7.7)
NEUTROS PCT: 62 %
Neutrophils Relative %: 52 %
PLATELETS: 338 10*3/uL (ref 150–400)
Platelets: 390 10*3/uL (ref 150–400)
RBC: 5.56 MIL/uL — ABNORMAL HIGH (ref 3.87–5.11)
RBC: 5.38 MIL/uL — ABNORMAL HIGH (ref 3.87–5.11)
RDW: 13.6 % (ref 11.5–15.5)
RDW: 13.6 % (ref 11.5–15.5)
WBC: 9.7 10*3/uL (ref 4.0–10.5)
WBC: 9.8 10*3/uL (ref 4.0–10.5)

## 2015-11-15 LAB — ETHANOL: Alcohol, Ethyl (B): <5 mg/dL (ref <5)

## 2015-11-15 LAB — HCG, QUANTITATIVE, PREGNANCY

## 2015-11-15 LAB — SODIUM, URINE, RANDOM: Sodium, Ur: 11 mmol/L

## 2015-11-15 LAB — CK: CK TOTAL: 758 U/L — AB (ref 38–234)

## 2015-11-15 LAB — CREATININE, URINE, RANDOM: Creatinine, Urine: 524.34 mg/dL

## 2015-11-15 LAB — HCG, SERUM, QUALITATIVE: Preg, Serum: NEGATIVE

## 2015-11-15 LAB — ACETAMINOPHEN LEVEL

## 2015-11-15 LAB — SALICYLATE LEVEL

## 2015-11-15 MED ORDER — SODIUM CHLORIDE 0.9 % IV SOLN
INTRAVENOUS | Status: DC
  Administered 2015-11-15: 07:00:00 via INTRAVENOUS

## 2015-11-15 MED ORDER — SODIUM CHLORIDE 0.9 % IV BOLUS (SEPSIS)
1000.0000 mL | Freq: Once | INTRAVENOUS | Status: AC
  Administered 2015-11-15: 1000 mL via INTRAVENOUS

## 2015-11-15 MED ORDER — HALOPERIDOL LACTATE 5 MG/ML IJ SOLN: 5.0000 mg | Freq: Four times a day (QID) | INTRAMUSCULAR | Status: DC | PRN

## 2015-11-15 MED ORDER — DEXTROSE 5 % IV SOLN
1.0000 g | Freq: Every day | INTRAVENOUS | Status: DC
  Administered 2015-11-15: 1 g via INTRAVENOUS
  Filled 2015-11-15 (×2): qty 10

## 2015-11-15 MED ORDER — BENZTROPINE MESYLATE 1 MG/ML IJ SOLN
1.0000 mg | Freq: Two times a day (BID) | INTRAMUSCULAR | Status: DC | PRN
  Filled 2015-11-15: qty 1

## 2015-11-15 MED ORDER — METRONIDAZOLE IN NACL 5-0.79 MG/ML-% IV SOLN
500.0000 mg | Freq: Three times a day (TID) | INTRAVENOUS | Status: DC
  Administered 2015-11-15 – 2015-11-16 (×4): 500 mg via INTRAVENOUS
  Filled 2015-11-15 (×4): qty 100

## 2015-11-15 MED ORDER — ACETAMINOPHEN 650 MG RE SUPP: 650.0000 mg | Freq: Four times a day (QID) | RECTAL | Status: DC | PRN

## 2015-11-15 MED ORDER — CITALOPRAM HYDROBROMIDE 20 MG PO TABS
20.0000 mg | ORAL_TABLET | Freq: Every day | ORAL | Status: DC
  Administered 2015-11-16: 20 mg via ORAL
  Filled 2015-11-15: qty 1

## 2015-11-15 MED ORDER — ONDANSETRON HCL 4 MG PO TABS: 4.0000 mg | ORAL_TABLET | Freq: Four times a day (QID) | ORAL | Status: DC | PRN

## 2015-11-15 MED ORDER — SODIUM CHLORIDE 0.9 % IV SOLN
INTRAVENOUS | Status: AC
  Administered 2015-11-15 (×3): via INTRAVENOUS

## 2015-11-15 MED ORDER — HYDRALAZINE HCL 20 MG/ML IJ SOLN: 10.0000 mg | INTRAMUSCULAR | Status: DC | PRN

## 2015-11-15 MED ORDER — ACETAMINOPHEN 325 MG PO TABS: 650.0000 mg | ORAL_TABLET | Freq: Four times a day (QID) | ORAL | Status: DC | PRN

## 2015-11-15 MED ORDER — ONDANSETRON HCL 4 MG/2ML IJ SOLN: 4.0000 mg | Freq: Four times a day (QID) | INTRAMUSCULAR | Status: DC | PRN

## 2015-11-15 MED ORDER — HEPARIN SODIUM (PORCINE) 5000 UNIT/ML IJ SOLN
5000.0000 [IU] | Freq: Three times a day (TID) | INTRAMUSCULAR | Status: DC
  Administered 2015-11-15 – 2015-11-16 (×2): 5000 [IU] via SUBCUTANEOUS
  Filled 2015-11-15 (×3): qty 1

## 2015-11-15 MED ORDER — QUETIAPINE FUMARATE 25 MG PO TABS
50.0000 mg | ORAL_TABLET | Freq: Two times a day (BID) | ORAL | Status: DC
  Administered 2015-11-15 – 2015-11-16 (×3): 50 mg via ORAL
  Filled 2015-11-15 (×3): qty 2

## 2015-11-15 MED ORDER — BENZTROPINE MESYLATE 1 MG PO TABS
1.0000 mg | ORAL_TABLET | Freq: Two times a day (BID) | ORAL | Status: DC | PRN
  Filled 2015-11-15: qty 1

## 2015-11-15 NOTE — Care Management Obs Status (Signed)
MEDICARE OBSERVATION STATUS NOTIFICATION   Patient Details  Name: Alicia Flores MRN: [id number] Date of Birth: [birth year]   Medicare Observation Status Notification Given:  Yes    Golda AcreDavis, Rhonda Lynn, RN 11/15/2015, 11:57 AM

## 2015-11-15 NOTE — ED Notes (Signed)
Pt is now talking to self.  Questioned to whom she was talking.  Pt stated "I'm not talking".

## 2015-11-15 NOTE — ED Notes (Signed)
Bed: WA20 Expected date:  Expected time:  Means of arrival:  Comments: 35 yo F  Weakness

## 2015-11-15 NOTE — ED Provider Notes (Signed)
CSN: [id number]     Arrival date & time 11/15/15  0010 History  By signing my name below, I, Tanda RockersMargaux Venter, attest that this documentation has been prepared under the direction and in the presence of Gilda Creasehristopher J Pollina, MD. Electronically Signed: Tanda RockersMargaux Venter, ED Scribe. 11/15/2015. 12:51 AM.   Chief Complaint  Patient presents with  . Psychiatric Evaluation    The history is provided by the patient. No language interpreter was used.   HPI Comments: Alicia Flores is a 35 y.o. female brought in by ambulance, with PMHx anxiety and depression who presents to the Emergency Department for psychiatric evaluation. Per triage report pt was picked up by [facility] at a fire station. She mentions that she was at [facility] tonight after having intermittent vaginal bleeding. She states that in the past she had to have "the head of the baby removed." Pt asked staff at [facility] if they could "clean the bacteria out." Pt is speaking to self during exam and states she is hearing voices. She mentioned she was "guided to" hurting herself today but was not allowed to. No SI or HI.   Per chart review: Pt was seen at [facility] earlier today for RUQ abdominal pain that began earlier today. Pt denies vaginal bleeding at the time but states she has had some spotting. Patient also stated that she had been pregnant at least 15 times between her periods and wanted to make sure that they fix the infection left over from those pregnancies.   Past Medical History  Diagnosis Date  . Anemia   . Sickle cell anemia (HCC)     sickle cell trait  . Depression   . Anxiety   . Irregular menstrual bleeding   . Cocaine abuse   . Substance abuse    Past Surgical History  Procedure Laterality Date  . Cesarean section      FTP  . Foot surgery     Family History  Problem Relation Age of Onset  . Hypertension Mother    Social History  Substance Use Topics  . Smoking status: Current Some Day Smoker --  0.50 packs/day    Types: Cigarettes    Last Attempt to Quit: 01/11/2011  . Smokeless tobacco: Never Used  . Alcohol Use: No     Comment: social   OB History    Gravida Para Term Preterm AB TAB SAB Ectopic Multiple Living   3 1 1  1  1   1      Review of Systems  Constitutional: Negative for fever.  Psychiatric/Behavioral: Positive for hallucinations. Negative for suicidal ideas.  All other systems reviewed and are negative.  Allergies  Other and Risperidone and related  Home Medications   Prior to Admission medications   Medication Sig Start Date End Date Taking? Authorizing Provider  amantadine (SYMMETREL) 100 MG capsule Take 100 mg by mouth daily.   Yes Historical Provider, MD  citalopram (CELEXA) 40 MG tablet Take 40 mg by mouth daily.   Yes Historical Provider, MD  ciprofloxacin (CIPRO) 250 MG tablet Take 1 tablet (250 mg total) by mouth every 12 (twelve) hours. Patient not taking: Reported on 11/15/2015 11/14/15   Marny LowensteinJulie N Wenzel, PA-C  polyethylene glycol [facility](MIRALAX / GLYCOLAX) packet Take 17 g by mouth daily. Patient not taking: Reported on 11/15/2015 09/03/15   Fayrene HelperBowie Tran, PA-C   BP 92/74 mmHg  Pulse 116  Temp(Src) 97.5 F (36.4 C) (Oral)  Resp 15  SpO2 100%  LMP 01/15/2015  Physical Exam  Constitutional: She is oriented to person, place, and time. She appears well-developed and well-nourished. No distress.  HENT:  Head: Normocephalic and atraumatic.  Right Ear: Hearing normal.  Left Ear: Hearing normal.  Nose: Nose normal.  Mouth/Throat: Oropharynx is clear and moist and mucous membranes are normal.  Eyes: Conjunctivae and EOM are normal. Pupils are equal, round, and reactive to light.  Neck: Normal range of motion. Neck supple.  Cardiovascular: Regular rhythm, S1 normal and S2 normal.  Exam reveals no gallop and no friction rub.   No murmur heard. Pulmonary/Chest: Effort normal and breath sounds normal. No respiratory distress. She exhibits no tenderness.   Abdominal: Soft. Normal appearance and bowel sounds are normal. There is no hepatosplenomegaly. There is no tenderness. There is no rebound, no guarding, no tenderness at McBurney's point and negative Murphy's sign. No hernia.  Musculoskeletal: Normal range of motion.  Neurological: She is alert and oriented to person, place, and time. She has normal strength. No cranial nerve deficit or sensory deficit. Coordination normal. GCS eye subscore is 4. GCS verbal subscore is 5. GCS motor subscore is 6.  Skin: Skin is warm, dry and intact. No rash noted. No cyanosis.  Psychiatric: She has a normal mood and affect. Her speech is normal. Thought content normal. She is actively hallucinating. She expresses no homicidal and no suicidal ideation.  Actively hallucinating. Pt is having ongoing conversations with someone that is not in the room.   Nursing note and vitals reviewed.   ED Course  Procedures (including critical care time)  DIAGNOSTIC STUDIES: Oxygen Saturation is 100% on RA, normal by my interpretation.    COORDINATION OF CARE: 12:49 AM-Discussed treatment plan which includes CMP, EtOH, CBC, Rapid drug screen with pt at bedside and pt agreed to plan.   Labs Review Labs Reviewed  COMPREHENSIVE METABOLIC PANEL - Abnormal; Notable for the following:    Chloride 100 (*)    Glucose, Bld 104 (*)    Creatinine, Ser 3.46 (*)    Total Protein 10.2 (*)    Albumin 5.1 (*)    GFR calc non Af Amer 16 (*)    GFR calc Af Amer 19 (*)    All other components within normal limits  CBC WITH DIFFERENTIAL/PLATELET - Abnormal; Notable for the following:    RBC 5.56 (*)    Hemoglobin 16.0 (*)    All other components within normal limits  ACETAMINOPHEN LEVEL - Abnormal; Notable for the following:    Acetaminophen (Tylenol), Serum <10 (*)    All other components within normal limits  ETHANOL  HCG, QUANTITATIVE, PREGNANCY  SALICYLATE LEVEL  URINE RAPID DRUG SCREEN, HOSP PERFORMED    Imaging  Review No results found. I have personally reviewed and evaluated these images and lab results as part of my medical decision-making.   EKG Interpretation   Date/Time:  Tuesday November 15 2015 00:36:41 EDT Ventricular Rate:  100 PR Interval:    QRS Duration: 81 QT Interval:  338 QTC Calculation: 436 R Axis:   53 Text Interpretation:  Sinus tachycardia Probable left atrial enlargement  Borderline T abnormalities, inferior leads Confirmed by POLLINA  MD,  CHRISTOPHER ([id number]) on 11/15/2015 2:59:47 AM      MDM   Final diagnoses:  AKI (acute kidney injury) (HCC)  Psychosis, unspecified psychosis type   Patient presents to the emergency department with concerns over possible pelvic infection. Patient reports that she has been pregnant at least 15 times and she thinks that the  pregnancy has been causing infections. She appears to be acutely psychotic. She is having a running conversation with someone who is not present in the room. She does admit to hearing voices. She has not, however, homicidal or suicidal. She does report a previous diagnosis of bipolar disorder but is not currently on any medications.  Patient had thorough evaluation at [facility] prior to coming to the ER. She was thought to have urinary tract infection and was started on Cipro. She was noted to have elevation of creatinine over her baseline. This has significantly elevated further. Creatinine yesterday was 2.71. Creatinine now is 3.46. She is not acidotic. No sinus congestion. Patient likely not taking much in orally due to her psychosis. She is tachycardic and borderline hypotensive. This will be treated with IV fluids.  She and will require psychiatric evaluation, unfortunately her acute kidney injury would preclude her being admitted to a psychiatric hospital. She will therefore require hospitalization on the medicine service for stabilization of her renal failure prior to psychiatric treatment. Discussed with Dr.  Toniann Fail, on call for hospitalist. He will admit the patient, requests renal ultrasound which has been ordered.  I personally performed the services described in this documentation, which was scribed in my presence. The recorded information has been reviewed and is accurate.      Gilda Crease, MD 11/15/15 [phone]

## 2015-11-15 NOTE — ED Notes (Signed)
Per [facility], picked up at the fire station.  She was a walk up coming [facility].  [facility]  At 1st she said she had a miscarriage today, then in the back of the truck she said it was last week.  Pt denies c/p, n/v/d.  Pt c/o weakness.

## 2015-11-15 NOTE — Consult Note (Signed)
[facility] Face-to-Face Psychiatry Consult   Reason for Consult:  Depression, psychotic hallucination/delusions and history of substance abuse Referring Physician:  Dr. Dreama Saa Patient Identification: Alicia Flores MRN:  [id number] Principal Diagnosis: Psychoses,  substance-induced psychosis Diagnosis:   Patient Active Problem List   Diagnosis Date Noted  . AKI (acute kidney injury) ([place]) [N17.9] 11/15/2015  . Psychoses [F29] 11/15/2015  . ARF (acute renal failure) ([place]) [N17.9] 11/15/2015    Total Time spent with patient: 1 hour  Subjective:   Alicia Flores is a 35 y.o. female patient admitted with, Depression, hallucinations and delusions.  HPI:  Alicia Flores is a 35 y.o. Female admitted to [facility] with acute kidney injury and psychosis. Patient appeared lying in her bed, awake, alert, oriented to herself and place. Information obtained from the patient is not reliable secondary to active psychotic symptoms and current mental status Patient reported she has been noncompliant with her medication including Risperdal and Celexa over 3-4 weeks. Patient is also endorses using cocaine 4 days ago. Patient started believing she is pregnant instead of [facility] testing negative and informed to her. Patient stated she wants second opinion. Patient also believes she has a somatic break pains throughout her body, and a bladder infection without any evidence. Patient reportedly become homeless 2 days ago and living on the streets. Patient reportedly having auditory and visual hallucinations which is endorsed by staff watching her heart patient denies. Patient denies suicidal/homicidal ideation, intention or plans. Patient is willing to take medication but asked to see for liquid medication. Patient is willing to take tablets or capsules when no liquid form is not available.  Medical history: Patient with depression, polysubstance abuse who had originally gone to the [facility]  complaining of possible pregnancy and possible UTI was found to be negative for pregnancy and was discharged home on Cipro for UTI presents to the ER at [facility] complaining of weakness and possible heat exhaustion. Patient also has been having delusional thoughts of being pregnant and other complaints. Denies any chest pain or shortness of breath nausea vomiting or diarrhea. Patient states she usually takes antidepressants and has been off it for last 2-3 weeks since she ran out of it and hasn't had refills. Denies any suicidal ideation. Patient agrees to have taken cocaine 2 days ago and smokes cigarettes. Patient's creatinine has been worsening over the last 24 hours and presently is around 3.4 and few hours ago was 2.7.  Past Psychiatric History: Bipolar depression, patient was previously treated by caring services in [facility] and later at [facility] Dr. Weyman Pedro.   Risk to Self: Is patient at risk for suicide?: No Risk to Others:   Prior Inpatient Therapy:   Prior Outpatient Therapy:    Past Medical History:  Past Medical History  Diagnosis Date  . Anemia   . Sickle cell anemia (HCC)     sickle cell trait  . Depression   . Anxiety   . Irregular menstrual bleeding   . Cocaine abuse   . Substance abuse     Past Surgical History  Procedure Laterality Date  . Cesarean section      FTP  . Foot surgery     Family History:  Family History  Problem Relation Age of Onset  . Hypertension Mother    Family Psychiatric  History: Patient endorses family history of mental illness especially her cousin and their children but not able to provide more details. Social History:  History  Alcohol Use No    Comment: social     History  Drug Use  . Yes  . Special: Cocaine, Marijuana    Comment: 1 gm every 2 days, Cocaine last used "a couple of days ago."    Social History   Social History  . Marital Status: Single    Spouse Name: N/A  . Number of Children: N/A   . Years of Education: N/A   Social History Main Topics  . Smoking status: Current Some Day Smoker -- 0.50 packs/day    Types: Cigarettes    Last Attempt to Quit: 01/11/2011  . Smokeless tobacco: Never Used  . Alcohol Use: No     Comment: social  . Drug Use: Yes    Special: Cocaine, Marijuana     Comment: 1 gm every 2 days, Cocaine last used "a couple of days ago."  . Sexual Activity: Yes    Birth Control/ Protection: None   Other Topics Concern  . None   Social History Narrative   Additional Social History:    Allergies:   Allergies  Allergen Reactions  . Other     Patient states that she is allergic to all prescription medications.  . Risperidone And Related Other (See Comments)    Excessive weight gain     Labs:  Results for orders placed or performed during the hospital encounter of 11/15/15 (from the past 48 hour(s))  Comprehensive metabolic panel     Status: Abnormal   Collection Time: 11/15/15  1:09 AM  Result Value Ref Range   Sodium 135 135 - 145 mmol/L   Potassium 4.0 3.5 - 5.1 mmol/L   Chloride 100 (L) 101 - 111 mmol/L   CO2 22 22 - 32 mmol/L   Glucose, Bld 104 (H) 65 - 99 mg/dL   BUN 19 6 - 20 mg/dL   Creatinine, Ser 3.46 (H) 0.44 - 1.00 mg/dL   Calcium 10.1 8.9 - 10.3 mg/dL   Total Protein 10.2 (H) 6.5 - 8.1 g/dL   Albumin 5.1 (H) 3.5 - 5.0 g/dL   AST 35 15 - 41 U/L   ALT 23 14 - 54 U/L   Alkaline Phosphatase 61 38 - 126 U/L   Total Bilirubin 1.1 0.3 - 1.2 mg/dL   GFR calc non Af Amer 16 (L) >60 mL/min   GFR calc Af Amer 19 (L) >60 mL/min    Comment: (NOTE) The eGFR has been calculated using the CKD EPI equation. This calculation has not been validated in all clinical situations. eGFR's persistently <60 mL/min signify possible Chronic Kidney Disease.    Anion gap 13 5 - 15  Ethanol     Status: None   Collection Time: 11/15/15  1:09 AM  Result Value Ref Range   Alcohol, Ethyl (B) <5 <5 mg/dL    Comment:        LOWEST DETECTABLE LIMIT  FOR SERUM ALCOHOL IS 5 mg/dL FOR MEDICAL PURPOSES ONLY   CBC with Diff     Status: Abnormal   Collection Time: 11/15/15  1:09 AM  Result Value Ref Range   WBC 9.7 4.0 - 10.5 K/uL   RBC 5.56 (H) 3.87 - 5.11 MIL/uL   Hemoglobin 16.0 (H) 12.0 - 15.0 g/dL   HCT 45.6 36.0 - 46.0 %   MCV 82.0 78.0 - 100.0 fL   MCH 28.8 26.0 - 34.0 pg   MCHC 35.1 30.0 - 36.0 g/dL   RDW 13.6 11.5 - 15.5 %   Platelets  390 150 - 400 K/uL   Neutrophils Relative % 62 %   Neutro Abs 6.0 1.7 - 7.7 K/uL   Lymphocytes Relative 28 %   Lymphs Abs 2.7 0.7 - 4.0 K/uL   Monocytes Relative 10 %   Monocytes Absolute 1.0 0.1 - 1.0 K/uL   Eosinophils Relative 0 %   Eosinophils Absolute 0.0 0.0 - 0.7 K/uL   Basophils Relative 0 %   Basophils Absolute 0.0 0.0 - 0.1 K/uL  hCG, quantitative, pregnancy     Status: None   Collection Time: 11/15/15  1:39 AM  Result Value Ref Range   hCG, Beta Chain, Quant, S <1 <5 mIU/mL    Comment:          GEST. AGE      CONC.  (mIU/mL)   <=1 WEEK        5 - 50     2 WEEKS       50 - 500     3 WEEKS       100 - 10,000     4 WEEKS     1,000 - 30,000     5 WEEKS     3,500 - 115,000   6-8 WEEKS     12,000 - 270,000    12 WEEKS     15,000 - 220,000        FEMALE AND NON-PREGNANT FEMALE:     LESS THAN 5 mIU/mL   Salicylate level     Status: None   Collection Time: 11/15/15  3:12 AM  Result Value Ref Range   Salicylate Lvl <3.1 2.8 - 30.0 mg/dL  Acetaminophen level     Status: Abnormal   Collection Time: 11/15/15  3:12 AM  Result Value Ref Range   Acetaminophen (Tylenol), Serum <10 (L) 10 - 30 ug/mL    Comment:        THERAPEUTIC CONCENTRATIONS VARY SIGNIFICANTLY. A RANGE OF 10-30 ug/mL MAY BE AN EFFECTIVE CONCENTRATION FOR MANY PATIENTS. HOWEVER, SOME ARE BEST TREATED AT CONCENTRATIONS OUTSIDE THIS RANGE. ACETAMINOPHEN CONCENTRATIONS >150 ug/mL AT 4 HOURS AFTER INGESTION AND >50 ug/mL AT 12 HOURS AFTER INGESTION ARE OFTEN ASSOCIATED WITH TOXIC REACTIONS.   hCG, serum,  qualitative     Status: None   Collection Time: 11/15/15  7:40 AM  Result Value Ref Range   Preg, Serum NEGATIVE NEGATIVE    Comment:        THE SENSITIVITY OF THIS METHODOLOGY IS >10 mIU/mL.   Comprehensive metabolic panel     Status: Abnormal   Collection Time: 11/15/15  7:40 AM  Result Value Ref Range   Sodium 135 135 - 145 mmol/L   Potassium 3.5 3.5 - 5.1 mmol/L   Chloride 103 101 - 111 mmol/L   CO2 20 (L) 22 - 32 mmol/L   Glucose, Bld 106 (H) 65 - 99 mg/dL   BUN 20 6 - 20 mg/dL   Creatinine, Ser 2.95 (H) 0.44 - 1.00 mg/dL   Calcium 9.5 8.9 - 10.3 mg/dL   Total Protein 8.8 (H) 6.5 - 8.1 g/dL   Albumin 4.5 3.5 - 5.0 g/dL   AST 30 15 - 41 U/L   ALT 20 14 - 54 U/L   Alkaline Phosphatase 52 38 - 126 U/L   Total Bilirubin 1.0 0.3 - 1.2 mg/dL   GFR calc non Af Amer 20 (L) >60 mL/min   GFR calc Af Amer 23 (L) >60 mL/min    Comment: (NOTE) The eGFR has been  calculated using the CKD EPI equation. This calculation has not been validated in all clinical situations. eGFR's persistently <60 mL/min signify possible Chronic Kidney Disease.    Anion gap 12 5 - 15  CBC WITH DIFFERENTIAL     Status: Abnormal   Collection Time: 11/15/15  7:40 AM  Result Value Ref Range   WBC 9.8 4.0 - 10.5 K/uL   RBC 5.38 (H) 3.87 - 5.11 MIL/uL   Hemoglobin 15.5 (H) 12.0 - 15.0 g/dL   HCT 43.1 36.0 - 46.0 %   MCV 80.1 78.0 - 100.0 fL   MCH 28.8 26.0 - 34.0 pg   MCHC 36.0 30.0 - 36.0 g/dL   RDW 13.6 11.5 - 15.5 %   Platelets 338 150 - 400 K/uL   Neutrophils Relative % 52 %   Lymphocytes Relative 36 %   Monocytes Relative 11 %   Eosinophils Relative 1 %   Basophils Relative 0 %   Neutro Abs 5.1 1.7 - 7.7 K/uL   Lymphs Abs 3.5 0.7 - 4.0 K/uL   Monocytes Absolute 1.1 (H) 0.1 - 1.0 K/uL   Eosinophils Absolute 0.1 0.0 - 0.7 K/uL   Basophils Absolute 0.0 0.0 - 0.1 K/uL   Smear Review MORPHOLOGY UNREMARKABLE   CK     Status: Abnormal   Collection Time: 11/15/15  7:40 AM  Result Value Ref Range    Total CK 758 (H) 38 - 234 U/L    Current Facility-Administered Medications  Medication Dose Route Frequency Provider Last Rate Last Dose  . 0.9 %  sodium chloride infusion   Intravenous Continuous Rise Patience, MD 125 mL/hr at 11/15/15 0700    . acetaminophen (TYLENOL) tablet 650 mg  650 mg Oral Q6H PRN Rise Patience, MD       Or  . acetaminophen (TYLENOL) suppository 650 mg  650 mg Rectal Q6H PRN Rise Patience, MD      . cefTRIAXone (ROCEPHIN) 1 g in dextrose 5 % 50 mL IVPB  1 g Intravenous Daily Rise Patience, MD   1 g at 11/15/15 0849  . heparin injection 5,000 Units  5,000 Units Subcutaneous Q8H Rise Patience, MD      . hydrALAZINE (APRESOLINE) injection 10 mg  10 mg Intravenous Q4H PRN Rise Patience, MD      . metroNIDAZOLE (FLAGYL) IVPB 500 mg  500 mg Intravenous Q8H Rise Patience, MD   500 mg at 11/15/15 0924  . ondansetron (ZOFRAN) tablet 4 mg  4 mg Oral Q6H PRN Rise Patience, MD       Or  . ondansetron (ZOFRAN) injection 4 mg  4 mg Intravenous Q6H PRN Rise Patience, MD        Musculoskeletal: Strength & Muscle Tone: within normal limits Gait & Station: normal Patient leans: Right  Psychiatric Specialty Exam: Physical Exam as per history and physical   ROS depression, anxiety, leg body pains, and delusional about being pregnant and the acute renal failure.  No Fever-chills, No Headache, No changes with Vision or hearing, reports vertigo No problems swallowing food or Liquids, No Chest pain, Cough or Shortness of Breath, No Abdominal pain, No Nausea or Vommitting, Bowel movements are regular, No Blood in stool or Urine, No dysuria, No new skin rashes or bruises, No new joints pains-aches,  No new weakness, tingling, numbness in any extremity, No recent weight gain or loss, No polyuria, polydypsia or polyphagia,   A full 10 point Review of Systems  was done, except as stated above, all other Review of Systems were  negative.  Blood pressure 138/87, pulse 81, temperature 97.6 F (36.4 C), temperature source Oral, resp. rate 16, last menstrual period 01/15/2015, SpO2 100 %.There is no weight on file to calculate BMI.  General Appearance: Bizarre, Disheveled and Guarded  Eye Contact:  Fair  Speech:  Blocked and Slow  Volume:  Normal  Mood:  Anxious and Depressed  Affect:  Inappropriate and Labile  Thought Process:  Disorganized and Irrelevant  Orientation:  Full (Time, Place, and Person)  Thought Content:  Illogical, Delusions, Hallucinations: Auditory Tactile Visual, Paranoid Ideation, Rumination and Tangential  Suicidal Thoughts:  No, not reliable  Homicidal Thoughts:  No  Memory:  Immediate;   Fair Recent;   Fair  Judgement:  Impaired  Insight:  Fair  Psychomotor Activity:  Restlessness  Concentration:  Concentration: Fair and Attention Span: Fair  Recall:  [employer] of Knowledge:  Fair  Language:  Good  Akathisia:  Negative  Handed:  Right  AIMS (if indicated):     Assets:  Communication Skills Desire for Improvement Financial Resources/Insurance Leisure Time Resilience Social Support Transportation  ADL's:  Impaired  Cognition:  Impaired,  Mild  Sleep:        Treatment Plan Summary: Patient presented with disorganized thinking, delusions being pregnant and having infection, increased irritability, agitation, noncompliant with the treatment, refusing take medication and reportedly had multiple psychosocial stresses including substance abuse. Recent substance abuse is cocaine in past substance abuse was marijuana. Patient is known for noncompliant with her medication management over years. Patient was not able to provide contact names and phone numbers during this visit   Daily contact with patient to assess and evaluate symptoms and progress in treatment and Medication management  Depression: We will start citalopram 20 mg daily Psychosis: We'll start Seroquel 50 mg twice daily  and also Haldol 5 mg every 6 hours when necessary for agitation and benztropine 1 mg twice daily by mouth or IM for EPS Substance abuse: Stay sober Psychosocial stresses; referred to the unit social service for cardiac information from the possible family members and also needed psychosocial support system.  Disposition: Recommend psychiatric Inpatient admission when medically cleared. Supportive therapy provided about ongoing stressors.  Ambrose Finland, MD 11/15/2015 10:48 AM

## 2015-11-15 NOTE — Progress Notes (Signed)
LCSWA attempted to complete assessment. Patient is still hallucinating and denies having any family members. LCSWA will attempt assessment at another time.  Vivi BarrackNicole Anjeanette Petzold, Theresia MajorsLCSWA, MSW Clinical Social Worker 5E and Psychiatric Service Line [phone] 11/15/2015  3:38 PM

## 2015-11-15 NOTE — Clinical Social Work Psych Assess (Signed)
Clinical Social Work Nature conservation officer  Clinical Social Worker:  Lia Hopping, LCSW Date/Time:  11/15/2015, 4:18 PM Referred By:  Care Management Date Referred:  11/15/15 Reason for Referral:  Behavioral Health Issues   Presenting Symptoms/Problems  Presenting Symptoms/Problems(in person's/family's own words): Patient is not reliable secondary to active psychotic symptoms and current mental status Patient reported she has been noncompliant with her medication including Risperdal and Celexa over 3-4 weeks. Patient is also endorses using cocaine 4 days ago. Patient also has been having delusional thoughts of being pregnant.   Abuse/Neglect/Trauma History  Abuse/Neglect/Trauma History:  Denies History Abuse/Neglect/Trauma History Comments (indicate dates):  Denies   Psychiatric History  Psychiatric History:  Inpatient/Hospitalization Psychiatric Medication:   Risperdal and Celexa    Current Mental Health Hospitalizations/Previous Mental Health History:  Patient hospitalized this year for inpatient Psych. [facility]   Current Provider: Patient  Place and Date:    Current Medications:   cephALEXin (KEFLEX) capsule 500 mg Dose: 500 mg Freq: Every 8 hours Route: PO Start: 11/16/15 2200   2200     0600    1400    2200     0600    1400    2200     0600    1400    2200     0600    1400    2200     0600    1400    2200     0600    1400    2200      citalopram (CELEXA) tablet 20 mg Dose: 20 mg Freq: Daily Route: PO Start: 11/16/15 1000   1103     1000     1000     1000     1000     1000     1000      heparin injection 5,000 Units Dose: 5,000 Units Freq: Every 8 hours Route: [place] Start: 11/15/15 1400   0502    1400    2200     0600    1400    2200     0600    1400    2200     0600    1400    2200     0600    1400    2200     0600    1400    2200     0600    1400    2200      metroNIDAZOLE (FLAGYL) tablet 500 mg Dose: 500 mg Freq: Every 12 hours Route: PO Start: 11/16/15 2200   2200     1000    2200     1000    2200     1000    2200     1000    2200     1000    2200     1000    2200      QUEtiapine (SEROQUEL) tablet 50 mg Dose: 50 mg Freq: 2 times daily Route: PO Start: 11/15/15 1330   1103    2200     1000    2200     1000    2200     1000    2200     1000    2200     1000    2200     1000    2200      Medications 11/16/15 11/17/15 11/18/15 11/19/15 11/20/15 11/21/15 11/22/15  Previous Inpatient Admission/Date/Reason:     Emotional Health/Current Symptoms  Suicide/Self Harm: None Reported Suicide Attempt in Past (date/description):  Patient denies having SI  Other Harmful Behavior (ex. homicidal ideation) (describe):  Patient denies being homicidal.   Psychotic/Dissociative Symptoms  Psychotic/Dissociative Symptoms: Auditory Hallucinations, Visual Hallucinations, Paranoia Other Psychotic/Dissociative Symptoms:  Patient reports she hearing and seeing children. Patient believes she is pregnant being told by [facility] she is not pregnant.    Attention/Behavioral Symptoms  Attention/Behavioral Symptoms:  Other Attention/Behavioral Symptoms:  Patient has been jumping out of bed, seeing things in hallway.    Cognitive Impairment  Cognitive Impairment:  Orientation - Self, Orientation - Place Other Cognitive Impairment:Patient is having auditory and visual hallucinations.    Mood and Adjustment  Mood and Adjustment:  Guarded, Anxious   Stress, Anxiety, Trauma, Any Recent Loss/Stressor  Stress, Anxiety, Trauma, Any Recent Loss/Stressor: Other - See Comment Anxiety (frequency):  Patient has been expressing concerns about missing children.   Phobia (specify): n/a  Compulsive Behavior (specify):  n/a  Obsessive Behavior  (specify):  n/a  Other Stress, Anxiety, Trauma, Any Recent Loss/Stressor:  Patient reports she became homeless prior to being admitted into hospital.    Substance Abuse/Use  Substance Abuse/Use: History of Substance Use, Current Substance Use SBIRT Completed (please refer for detailed history): No Self-reported Substance Use (last use and frequency):  Cocaine use, four days before admission.   Urinary Drug Screen Completed: Yes Alcohol Level:     Environment/Housing/Living Arrangement  Environmental/Housing/Living Arrangement: Stable Housing. Patient reports she became homeless two days ago.  Patient mother reports patient lives in [facility]. Patient daughter has been living with patient mother. Who is in the Home:  Self/Daughter  Emergency Contact:  Mother   Financial  Financial: Medicare   Patient's Strengths and Goals  Patient's Strengths and Goals (patient's own words):  Patient has been cooperative with medical staff.    Clinical Social Worker's Interpretive Summary  Clinical Social Workers Interpretive Summary:  LCSWA met with patient at bedside. Patient attempted to run hallway, still actively hallucinating durirng assessment. Patient became guarded when asked about her family supports. Patient denied having any family and refused to answer further questions. LCSWA gathered collateral information from patient mother, that lives in [place]. Patient mother reports she was unaware of her daughter current hospitailization. Patient has been going to her PCP for medication management and recently switched medications for her Bipolar due to weight gain. Patient mother states patient has family in [place], [place] and cousins. Patient was hospitalized recently this year to inpatient [facility] for psychosis.    Disposition  Disposition: Inpatient Referral Made [facility] [place])

## 2015-11-15 NOTE — ED Notes (Signed)
Pt changed in to paper scrubs.  Security called for wanding.

## 2015-11-15 NOTE — Progress Notes (Signed)
Pharmacy Antibiotic Note  Alicia DunLatoya Flores is a 35 y.o. female with PMHx depression, anxiety, and polysubstance abuse recently started on ciprofloxacin for UTI admitted on 11/15/2015 with ARF and psychosis, also found to have trichomonasis with UTI.  Pharmacy has been consulted for Ceftriaxone dosing for UTI. MD dosing flagyl.  No antibiotic allergies noted.   Plan: Ceftriaxone 1g IV q24h  Dosage remains stable and need for further dosage adjustment appears unlikely at present.    Will sign off at this time.  Please reconsult if a change in clinical status warrants re-evaluation of dosage.  Haynes Hoehnolleen Beulah Capobianco, PharmD, BCPS 11/15/2015, 7:45 AM  Pager: [phone]     Temp (24hrs), Avg:97.9 F (36.6 C), Min:97.5 F (36.4 C), Max:98.6 F (37 C)   Recent Labs Lab 11/14/15 1901 11/15/15 0109  WBC 8.9 9.7  CREATININE 2.71* 3.46*    Estimated Creatinine Clearance: 25 mL/min (by C-G formula based on Cr of 3.46).    Allergies  Allergen Reactions  . Other     Patient states that she is allergic to all prescription medications.  . Risperidone And Related Other (See Comments)    Excessive weight gain     Thank you for allowing pharmacy to be a part of this patient's care.

## 2015-11-15 NOTE — ED Notes (Signed)
Pt smiling & talking to others that are not present in the room.

## 2015-11-15 NOTE — H&P (Addendum)
History and Physical    Alicia Flores RUE:[id number] DOB: [birth year] DOA: 11/15/2015  PCP: Pcp Not In System  Patient coming from: Home.  Chief Complaint: Multiple complaints.  HPI: Alicia Flores is a 35 y.o. female with depression, polysubstance abuse who had originally gone to the [facility] complaining of possible pregnancy and possible UTI was found to be negative for pregnancy and was discharged home on Cipro for UTI presents to the ER at [facility] complaining of weakness and possible heat exhaustion. Patient also has been having delusional thoughts of being pregnant and other complaints. Denies any chest pain or shortness of breath nausea vomiting or diarrhea. Patient states she usually takes antidepressants and has been off it for last 2-3 weeks since she ran out of it and hasn't had refills. Denies any suicidal ideation. Patient agrees to have  taken cocaine 2 days ago and smokes cigarettes. Patient's creatinine has been worsening over the last 24 hours and presently is around 3.4 and few hours ago was 2.7.  ED Course: Was given fluid bolus.  Review of Systems: As per HPI, rest all negative.   Past Medical History  Diagnosis Date  . Anemia   . Sickle cell anemia (HCC)     sickle cell trait  . Depression   . Anxiety   . Irregular menstrual bleeding   . Cocaine abuse   . Substance abuse     Past Surgical History  Procedure Laterality Date  . Cesarean section      FTP  . Foot surgery       reports that she has been smoking Cigarettes.  She has been smoking about 0.50 packs per day. She has never used smokeless tobacco. She reports that she uses illicit drugs (Cocaine and Marijuana). She reports that she does not drink alcohol.  Allergies  Allergen Reactions  . Other     Patient states that she is allergic to all prescription medications.  . Risperidone And Related Other (See Comments)    Excessive weight gain     Family History  Problem  Relation Age of Onset  . Hypertension Mother     Prior to Admission medications   Medication Sig Start Date End Date Taking? Authorizing Provider  amantadine (SYMMETREL) 100 MG capsule Take 100 mg by mouth daily.   Yes Historical Provider, MD  citalopram (CELEXA) 40 MG tablet Take 40 mg by mouth daily.   Yes Historical Provider, MD  ciprofloxacin (CIPRO) 250 MG tablet Take 1 tablet (250 mg total) by mouth every 12 (twelve) hours. Patient not taking: Reported on 11/15/2015 11/14/15   Marny Lowenstein, PA-C  polyethylene glycol [facility] / GLYCOLAX) packet Take 17 g by mouth daily. Patient not taking: Reported on 11/15/2015 09/03/15   Fayrene Helper, PA-C    Physical Exam: Filed Vitals:   11/15/15 0010 11/15/15 0012 11/15/15 0453 11/15/15 0646  BP:  92/74 101/72 138/87  Pulse:  116 106 81  Temp:  97.5 F (36.4 C) 97.8 F (36.6 C) 97.6 F (36.4 C)  TempSrc:  Oral Oral Oral  Resp:  SpO2: 97% 100% 98% 100%      Constitutional: Not in distress. Filed Vitals:   11/15/15 0010 11/15/15 0012 11/15/15 0453 11/15/15 0646  BP:  92/74 101/72 138/87  Pulse:  116 106 81  Temp:  97.5 F (36.4 C) 97.8 F (36.6 C) 97.6 F (36.4 C)  TempSrc:  Oral Oral Oral  Resp:  15 18 16  SpO2: 97% 100% 98% 100%   Eyes: Anicteric. No pallor. ENMT: No discharge from the ears eyes nose and mouth. Neck: No mass felt. No JVD appreciated. Respiratory: No rhonchi or crepitations. Cardiovascular: S1-S2 heard. Abdomen: Soft nontender bowel sounds present. Musculoskeletal: No edema. Skin: No rash. Neurologic: Alert awake oriented to time place and person. Moves all extremities. Psychiatric: As multiple delusional thoughts and psychosis.   Labs on Admission: I have personally reviewed following labs and imaging studies  CBC:  Recent Labs Lab 11/14/15 1901 11/15/15 0109  WBC 8.9 9.7  NEUTROABS  --  6.0  HGB 16.3* 16.0*  HCT 45.6 45.6  MCV 81.6 82.0  PLT 404* 390   Basic Metabolic  Panel:  Recent Labs Lab 11/14/15 1901 11/15/15 0109  NA 135 135  K 4.3 4.0  CL 100* 100*  CO2 24 22  GLUCOSE 99 104*  BUN 18 19  CREATININE 2.71* 3.46*  CALCIUM 10.4* 10.1   GFR: Estimated Creatinine Clearance: 25 mL/min (by C-G formula based on Cr of 3.46). Liver Function Tests:  Recent Labs Lab 11/14/15 1901 11/15/15 0109  AST 30 35  ALT 23 23  ALKPHOS 63 61  BILITOT 1.1 1.1  PROT 10.0* 10.2*  ALBUMIN 5.1* 5.1*    Recent Labs Lab 11/14/15 1901  LIPASE 26  AMYLASE 70   No results for input(s): AMMONIA in the last 168 hours. Coagulation Profile: No results for input(s): INR, PROTIME in the last 168 hours. Cardiac Enzymes: No results for input(s): CKTOTAL, CKMB, CKMBINDEX, TROPONINI in the last 168 hours. BNP (last 3 results) No results for input(s): PROBNP in the last 8760 hours. HbA1C: No results for input(s): HGBA1C in the last 72 hours. CBG: No results for input(s): GLUCAP in the last 168 hours. Lipid Profile: No results for input(s): CHOL, HDL, LDLCALC, TRIG, CHOLHDL, LDLDIRECT in the last 72 hours. Thyroid Function Tests: No results for input(s): TSH, T4TOTAL, FREET4, T3FREE, THYROIDAB in the last 72 hours. Anemia Panel: No results for input(s): VITAMINB12, FOLATE, FERRITIN, TIBC, IRON, RETICCTPCT in the last 72 hours. Urine analysis:    Component Value Date/Time   COLORURINE YELLOW 11/14/2015 1920   APPEARANCEUR CLOUDY* 11/14/2015 1920   LABSPEC >1.030* 11/14/2015 1920   PHURINE 5.0 11/14/2015 1920   GLUCOSEU NEGATIVE 11/14/2015 1920   HGBUR TRACE* 11/14/2015 1920   BILIRUBINUR MODERATE* 11/14/2015 1920   KETONESUR 15* 11/14/2015 1920   PROTEINUR 100* 11/14/2015 1920   UROBILINOGEN 1.0 06/19/2012 2140   NITRITE NEGATIVE 11/14/2015 1920   LEUKOCYTESUR TRACE* 11/14/2015 1920   Sepsis Labs: @LABRCNTIP (procalcitonin:4,lacticidven:4) )No results found for this or any previous visit (from the past 240 hour(s)).   Radiological Exams on  Admission: No results found. EKG - sinus tachycardia with nonspecific T-wave changes.  Assessment/Plan Principal Problem:   AKI (acute kidney injury) (HCC) Active Problems:   Psychoses   ARF (acute renal failure) (HCC)    1. Acute renal failure nonoliguric - cause not clear. Patient was mildly hypertensive in the ER. UA shows features consistent with UTI and Trichomonas is. Check FENa renal ultrasound and continue with hydration. Closely follow intake and output and metabolic panel. Check CK levels. 2. Psychosis - patient will need psychiatric consult at this time has no suicidal ideation. 3. Polysubstance abuse - will need counseling. Urine drug screen is pending. Check HIV status. 4. UTI and Trichomonasis - follow urine cultures patient is on ceftriaxone and Flagyl.  Patient's home medication has to be verified.   DVT prophylaxis: Heparin. Code  Status: Full code.  Family Communication: No family at the bedside.  Disposition Plan: Home.  Consults called: None.  Admission status: Observation telemetry.    Eduard ClosKAKRAKANDY,Jenisha Faison N. MD [facility] Pager [phone].  If 7PM-7AM, please contact night-coverage [email or web address] Password [email or web address]  11/15/2015, 7:21 AM

## 2015-11-15 NOTE — ED Notes (Addendum)
Pt  & 2 personal belonging bags wanded by Security.

## 2015-11-15 NOTE — ED Notes (Signed)
Pt stating "I was @ [facility] until 10 tonight.  They told me I had an infection where the fetus was trying to come out.  It goes and comes.  They gave me abx."  [facility]  Pt stated "I was taking medicine for bipolar but I just take the medicine when I need it.  It was really worthless."  Pt is rambling.

## 2015-11-15 NOTE — Progress Notes (Signed)
  History and Physical  Alicia Flores ZOX:[id number] DOB: 1980-11-08 DOA: 11/15/2015  Subjective: She has no complaints, feeling well this morning. When I asked her why she did not have her breakfast, she said people may have put "poison" or "something" in it. She denied hallucinations but seems to have had them per the nurse earlier. Not suicidal.   Physical Exam: BP 138/87 mmHg  Pulse 81  Temp(Src) 97.6 F (36.4 C) (Oral)  Resp 16  SpO2 100%  LMP 01/15/2015  GENERAL :   Alert and cooperative, and appears to be in no acute distress. HEAD:           normocephalic. THROAT:     Oral cavity and pharynx normal.   NECK:          supple, non-tender.  CARDIAC:    Normal S1 and S2. No gallop. No murmurs.  Vascular:     no peripheral edema.  LUNGS:       Clear to auscultation  ABDOMEN: Positive bowel sounds. Soft, nondistended, nontender. No guarding or rebound.      MSK:           No joint erythema or tenderness.  EXT           : No significant deformity or joint abnormality. Neuro        : Alert, oriented to person, place, and time.                      CN II-XII intact.  SKIN:            No rash. No lesions. PSYCH:      Patient is not suicidal.          Labs on Admission:  Reviewed.   Radiological Exams on Admission: [place] Renal  11/15/2015  CLINICAL DATA:  Renal failure EXAM: RENAL / URINARY TRACT ULTRASOUND COMPLETE COMPARISON:  None. FINDINGS: Right Kidney: Length: 9.8 cm. Echogenicity within normal limits. No mass or hydronephrosis visualized. Left Kidney: Length: 9.7 cm. Echogenicity within normal limits. No mass or hydronephrosis visualized. Bladder: Appears normal for degree of bladder distention. IMPRESSION: No evidence of hydronephrosis.  No acute findings. Electronically Signed   By: Charlett NoseKevin  Dover M.D.   On: 11/15/2015 07:45     Assessment/Plan  1. Acute renal failure nonoliguric - cause not clear.  US unremarkable Cr improving  UA shows features consistent with  UTI : cont Ceftriaxone She has Triachomonas per HPI, cont flagyl Closely follow intake and output and metabolic panel.  CK is high, will continue IVF, monitor.  will check bladder scan this am  2. Psychosis - patient will need psychiatric consult at this time has no suicidal ideation.                            Will place a psych consult for possible schizoaffective disorder.  3. Polysubstance abuse - will need counseling. Urine drug screen is pending.        Check HIV status.   4. UTI and Trichomonasis - follow urine cultures patient is on ceftriaxone and Flagyl.     DVT prophylaxis: Heparin. Code Status: Full code.  Family Communication: No family at the bedside.  Disposition Plan: Home.  Consults called: None.  Admission status: Observation telemetry.   Eston EstersAhmad Darielle Hancher M.D [facility]

## 2015-11-15 NOTE — ED Notes (Signed)
While obtaining a set of vital signs, patient was carrying on a conversation with herself.

## 2015-11-15 NOTE — ED Notes (Signed)
Dwana CurdVera, RN given report.

## 2015-11-16 DIAGNOSIS — F29 Unspecified psychosis not due to a substance or known physiological condition: Secondary | ICD-10-CM

## 2015-11-16 DIAGNOSIS — F28 Other psychotic disorder not due to a substance or known physiological condition: Secondary | ICD-10-CM | POA: Diagnosis not present

## 2015-11-16 DIAGNOSIS — F1414 Cocaine abuse with cocaine-induced mood disorder: Secondary | ICD-10-CM

## 2015-11-16 DIAGNOSIS — E876 Hypokalemia: Secondary | ICD-10-CM

## 2015-11-16 DIAGNOSIS — N179 Acute kidney failure, unspecified: Secondary | ICD-10-CM | POA: Diagnosis not present

## 2015-11-16 DIAGNOSIS — F19951 Other psychoactive substance use, unspecified with psychoactive substance-induced psychotic disorder with hallucinations: Secondary | ICD-10-CM

## 2015-11-16 DIAGNOSIS — F3160 Bipolar disorder, current episode mixed, unspecified: Secondary | ICD-10-CM | POA: Diagnosis not present

## 2015-11-16 LAB — URINE CULTURE

## 2015-11-16 LAB — CBC
HCT: 33.4 % — ABNORMAL LOW (ref 36.0–46.0)
HEMOGLOBIN: 11.5 g/dL — AB (ref 12.0–15.0)
MCH: 28.4 pg (ref 26.0–34.0)
MCHC: 34.4 g/dL (ref 30.0–36.0)
MCV: 82.5 fL (ref 78.0–100.0)
Platelets: 304 10*3/uL (ref 150–400)
RBC: 4.05 MIL/uL (ref 3.87–5.11)
RDW: 13.9 % (ref 11.5–15.5)
WBC: 5.5 10*3/uL (ref 4.0–10.5)

## 2015-11-16 LAB — CK: CK TOTAL: 498 U/L — AB (ref 38–234)

## 2015-11-16 LAB — BASIC METABOLIC PANEL
ANION GAP: 7 (ref 5–15)
BUN: 17 mg/dL (ref 6–20)
CALCIUM: 8.3 mg/dL — AB (ref 8.9–10.3)
CO2: 22 mmol/L (ref 22–32)
Chloride: 109 mmol/L (ref 101–111)
Creatinine, Ser: 1.45 mg/dL — ABNORMAL HIGH (ref 0.44–1.00)
GFR, EST AFRICAN AMERICAN: 55 mL/min — AB (ref >60)
GFR, EST NON AFRICAN AMERICAN: 47 mL/min — AB (ref >60)
Glucose, Bld: 103 mg/dL — ABNORMAL HIGH (ref 65–99)
Potassium: 3.4 mmol/L — ABNORMAL LOW (ref 3.5–5.1)
SODIUM: 138 mmol/L (ref 135–145)

## 2015-11-16 MED ORDER — CEPHALEXIN 500 MG PO CAPS: 500.0000 mg | ORAL_CAPSULE | Freq: Three times a day (TID) | ORAL | Status: DC

## 2015-11-16 MED ORDER — METRONIDAZOLE 500 MG PO TABS: 500.0000 mg | ORAL_TABLET | Freq: Two times a day (BID) | ORAL | Status: DC

## 2015-11-16 MED ORDER — CEPHALEXIN 500 MG PO CAPS
500.0000 mg | ORAL_CAPSULE | Freq: Once | ORAL | Status: AC
  Administered 2015-11-16: 500 mg via ORAL
  Filled 2015-11-16: qty 1

## 2015-11-16 MED ORDER — CEPHALEXIN 500 MG PO CAPS: 500.0000 mg | ORAL_CAPSULE | Freq: Three times a day (TID) | ORAL | Status: AC

## 2015-11-16 MED ORDER — METRONIDAZOLE 500 MG PO TABS: 500.0000 mg | ORAL_TABLET | Freq: Two times a day (BID) | ORAL | Status: AC

## 2015-11-16 MED ORDER — POTASSIUM CHLORIDE CRYS ER 20 MEQ PO TBCR
40.0000 meq | EXTENDED_RELEASE_TABLET | Freq: Once | ORAL | Status: AC
  Administered 2015-11-16: 40 meq via ORAL
  Filled 2015-11-16: qty 2

## 2015-11-16 MED ORDER — METRONIDAZOLE 500 MG PO TABS: 500.0000 mg | ORAL_TABLET | Freq: Three times a day (TID) | ORAL | Status: DC

## 2015-11-16 MED ORDER — CITALOPRAM HYDROBROMIDE 20 MG PO TABS: 20.0000 mg | ORAL_TABLET | Freq: Every day | ORAL | Status: AC

## 2015-11-16 MED ORDER — CEPHALEXIN 500 MG PO CAPS
500.0000 mg | ORAL_CAPSULE | Freq: Three times a day (TID) | ORAL | Status: DC
  Filled 2015-11-16: qty 1

## 2015-11-16 MED ORDER — QUETIAPINE FUMARATE 50 MG PO TABS: 50.0000 mg | ORAL_TABLET | Freq: Two times a day (BID) | ORAL | Status: AC

## 2015-11-16 MED ORDER — CEFTRIAXONE SODIUM 1 G IJ SOLR
1.0000 g | Freq: Every day | INTRAMUSCULAR | Status: DC
  Filled 2015-11-16: qty 10

## 2015-11-16 MED ORDER — LORAZEPAM 1 MG PO TABS
1.0000 mg | ORAL_TABLET | Freq: Once | ORAL | Status: AC
  Administered 2015-11-16: 1 mg via ORAL
  Filled 2015-11-16: qty 1

## 2015-11-16 MED ORDER — DEXTROSE 5 % IV SOLN: 1.0000 g | Freq: Every day | INTRAVENOUS | Status: DC

## 2015-11-16 NOTE — Progress Notes (Signed)
Upon leader rounding on patient, she complains that no one is listening to her. When we discussed her concerns, she states that she is still bleeding from a miscarriage. I discussed her treatment plan as charted and asked if I could assess where she is bleeding from. Patient jumped up out of bed to use the bathroom. No blood in urine. No blood on sheets. Additionally, while patient in the bathroom, she was talking to herself. She then stated to me, "you didn't shut the door like we asked you to". I assisted her back to bed and cut the bed alarm on.

## 2015-11-16 NOTE — Consult Note (Signed)
[facility] Face-to-Face Psychiatry Consult   Reason for Consult:  Depression, psychotic hallucination/delusions and history of substance abuse Referring Physician:  Dr. Dreama Saa Patient Identification: Alicia Flores MRN:  [id number] Principal Diagnosis: Psychoses,  substance-induced psychosis Diagnosis:   Patient Active Problem List   Diagnosis Date Noted  . AKI (acute kidney injury) ([place]) [N17.9] 11/15/2015  . Psychoses [F29] 11/15/2015  . ARF (acute renal failure) ([place]) [N17.9] 11/15/2015  . Cocaine abuse with cocaine-induced mood disorder ([place]) [F14.14]   . Bipolar I disorder, most recent episode mixed ([place]) [F31.60]   . Substance-induced psychotic disorder with hallucinations ([place]) [F19.951]     Total Time spent with patient: 1 hour  Subjective:   Alicia Flores is a 35 y.o. female patient admitted with, Depression, hallucinations and delusions.  HPI:  [name] is a 35 y.o. Female admitted to [facility] with acute kidney injury and psychosis. Patient appeared lying in her bed, awake, alert, oriented to herself and place. Information obtained from the patient is not reliable secondary to active psychotic symptoms and current mental status Patient reported she has been noncompliant with her medication including Risperdal and Celexa over 3-4 weeks. Patient is also endorses using cocaine 4 days ago. Patient started believing she is pregnant instead of [facility] testing negative and informed to her. Patient stated she wants second opinion. Patient also believes she has a somatic break pains throughout her body, and a bladder infection without any evidence. Patient reportedly become homeless 2 days ago and living on the streets. Patient reportedly having auditory and visual hallucinations which is endorsed by staff watching her heart patient denies. Patient denies suicidal/homicidal ideation, intention or plans. Patient is willing to take medication but asked to see for liquid  medication. Patient is willing to take tablets or capsules when no liquid form is not available.  Medical history: Patient with depression, polysubstance abuse who had originally gone to the [facility] complaining of possible pregnancy and possible UTI was found to be negative for pregnancy and was discharged home on Cipro for UTI presents to the ER at [facility] complaining of weakness and possible heat exhaustion. Patient also has been having delusional thoughts of being pregnant and other complaints. Denies any chest pain or shortness of breath nausea vomiting or diarrhea. Patient states she usually takes antidepressants and has been off it for last 2-3 weeks since she ran out of it and hasn't had refills. Denies any suicidal ideation. Patient agrees to have taken cocaine 2 days ago and smokes cigarettes. Patient's creatinine has been worsening over the last 24 hours and presently is around 3.4 and few hours ago was 2.7.  Past Psychiatric History: Bipolar depression, patient was previously treated by caring services in [facility] and later at [facility] [facility]", Dr. Weyman Pedro.  11/16/2015 Interval history: Patient seen for this psychiatric consultation follow-up today and case discussed with the LCSW and staff RN. Patient continued to be psychotic with auditory and visual hallucinations. Patient was seen several times by several staff members talking to herself and talking to somebody in the room when there is nobody. Patient appeared with a bizarre behaviors, constricted affect and minimizing her symptoms when asked. Patient stated her plan is making a few phone calls and going for shopping. Patient continued to be guarded and has poor insight and judgment at this time. Patient continued to make criteria for acute psychiatric hospitalization for crisis stabilization, safety monitoring and medication management. Patient is currently under involuntary commitment and  will be  transferring to [facility] has for the LCSW report.   Risk to Self: Is patient at risk for suicide?: No Risk to Others:   Prior Inpatient Therapy:   Prior Outpatient Therapy:    Past Medical History:  Past Medical History  Diagnosis Date  . Anemia   . Sickle cell anemia (HCC)     sickle cell trait  . Depression   . Anxiety   . Irregular menstrual bleeding   . Cocaine abuse   . Substance abuse     Past Surgical History  Procedure Laterality Date  . Cesarean section      FTP  . Foot surgery     Family History:  Family History  Problem Relation Age of Onset  . Hypertension Mother    Family Psychiatric  History: Patient endorses family history of mental illness especially her cousin and their children but not able to provide more details. Social History:  History  Alcohol Use No    Comment: social     History  Drug Use  . Yes  . Special: Cocaine, Marijuana    Comment: 1 gm every 2 days, Cocaine last used "a couple of days ago."    Social History   Social History  . Marital Status: Single    Spouse Name: N/A  . Number of Children: N/A  . Years of Education: N/A   Social History Main Topics  . Smoking status: Current Some Day Smoker -- 0.50 packs/day    Types: Cigarettes    Last Attempt to Quit: 01/11/2011  . Smokeless tobacco: Never Used  . Alcohol Use: No     Comment: social  . Drug Use: Yes    Special: Cocaine, Marijuana     Comment: 1 gm every 2 days, Cocaine last used "a couple of days ago."  . Sexual Activity: Yes    Birth Control/ Protection: None   Other Topics Concern  . None   Social History Narrative   Additional Social History:    Allergies:   Allergies  Allergen Reactions  . Other     Patient states that she is allergic to all prescription medications.  . Risperidone And Related Other (See Comments)    Excessive weight gain     Labs:  Results for orders placed or performed during the hospital encounter of 11/15/15 (from the  past 48 hour(s))  Comprehensive metabolic panel     Status: Abnormal   Collection Time: 11/15/15  1:09 AM  Result Value Ref Range   Sodium 135 135 - 145 mmol/L   Potassium 4.0 3.5 - 5.1 mmol/L   Chloride 100 (L) 101 - 111 mmol/L   CO2 22 22 - 32 mmol/L   Glucose, Bld 104 (H) 65 - 99 mg/dL   BUN 19 6 - 20 mg/dL   Creatinine, Ser 3.46 (H) 0.44 - 1.00 mg/dL   Calcium 10.1 8.9 - 10.3 mg/dL   Total Protein 10.2 (H) 6.5 - 8.1 g/dL   Albumin 5.1 (H) 3.5 - 5.0 g/dL   AST 35 15 - 41 U/L   ALT 23 14 - 54 U/L   Alkaline Phosphatase 61 38 - 126 U/L   Total Bilirubin 1.1 0.3 - 1.2 mg/dL   GFR calc non Af Amer 16 (L) >60 mL/min   GFR calc Af Amer 19 (L) >60 mL/min    Comment: (NOTE) The eGFR has been calculated using the CKD EPI equation. This calculation has not been validated in all clinical  situations. eGFR's persistently <60 mL/min signify possible Chronic Kidney Disease.    Anion gap 13 5 - 15  Ethanol     Status: None   Collection Time: 11/15/15  1:09 AM  Result Value Ref Range   Alcohol, Ethyl (B) <5 <5 mg/dL    Comment:        LOWEST DETECTABLE LIMIT FOR SERUM ALCOHOL IS 5 mg/dL FOR MEDICAL PURPOSES ONLY   CBC with Diff     Status: Abnormal   Collection Time: 11/15/15  1:09 AM  Result Value Ref Range   WBC 9.7 4.0 - 10.5 K/uL   RBC 5.56 (H) 3.87 - 5.11 MIL/uL   Hemoglobin 16.0 (H) 12.0 - 15.0 g/dL   HCT 45.6 36.0 - 46.0 %   MCV 82.0 78.0 - 100.0 fL   MCH 28.8 26.0 - 34.0 pg   MCHC 35.1 30.0 - 36.0 g/dL   RDW 13.6 11.5 - 15.5 %   Platelets 390 150 - 400 K/uL   Neutrophils Relative % 62 %   Neutro Abs 6.0 1.7 - 7.7 K/uL   Lymphocytes Relative 28 %   Lymphs Abs 2.7 0.7 - 4.0 K/uL   Monocytes Relative 10 %   Monocytes Absolute 1.0 0.1 - 1.0 K/uL   Eosinophils Relative 0 %   Eosinophils Absolute 0.0 0.0 - 0.7 K/uL   Basophils Relative 0 %   Basophils Absolute 0.0 0.0 - 0.1 K/uL  hCG, quantitative, pregnancy     Status: None   Collection Time: 11/15/15  1:39 AM  Result  Value Ref Range   hCG, Beta Chain, Quant, S <1 <5 mIU/mL    Comment:          GEST. AGE      CONC.  (mIU/mL)   <=1 WEEK        5 - 50     2 WEEKS       50 - 500     3 WEEKS       100 - 10,000     4 WEEKS     1,000 - 30,000     5 WEEKS     3,500 - 115,000   6-8 WEEKS     12,000 - 270,000    12 WEEKS     15,000 - 220,000        FEMALE AND NON-PREGNANT FEMALE:     LESS THAN 5 mIU/mL   Salicylate level     Status: None   Collection Time: 11/15/15  3:12 AM  Result Value Ref Range   Salicylate Lvl <2.8 2.8 - 30.0 mg/dL  Acetaminophen level     Status: Abnormal   Collection Time: 11/15/15  3:12 AM  Result Value Ref Range   Acetaminophen (Tylenol), Serum <10 (L) 10 - 30 ug/mL    Comment:        THERAPEUTIC CONCENTRATIONS VARY SIGNIFICANTLY. A RANGE OF 10-30 ug/mL MAY BE AN EFFECTIVE CONCENTRATION FOR MANY PATIENTS. HOWEVER, SOME ARE BEST TREATED AT CONCENTRATIONS OUTSIDE THIS RANGE. ACETAMINOPHEN CONCENTRATIONS >150 ug/mL AT 4 HOURS AFTER INGESTION AND >50 ug/mL AT 12 HOURS AFTER INGESTION ARE OFTEN ASSOCIATED WITH TOXIC REACTIONS.   hCG, serum, qualitative     Status: None   Collection Time: 11/15/15  7:40 AM  Result Value Ref Range   Preg, Serum NEGATIVE NEGATIVE    Comment:        THE SENSITIVITY OF THIS METHODOLOGY IS >10 mIU/mL.   Comprehensive metabolic panel     Status:  Abnormal   Collection Time: 11/15/15  7:40 AM  Result Value Ref Range   Sodium 135 135 - 145 mmol/L   Potassium 3.5 3.5 - 5.1 mmol/L   Chloride 103 101 - 111 mmol/L   CO2 20 (L) 22 - 32 mmol/L   Glucose, Bld 106 (H) 65 - 99 mg/dL   BUN 20 6 - 20 mg/dL   Creatinine, Ser 2.95 (H) 0.44 - 1.00 mg/dL   Calcium 9.5 8.9 - 10.3 mg/dL   Total Protein 8.8 (H) 6.5 - 8.1 g/dL   Albumin 4.5 3.5 - 5.0 g/dL   AST 30 15 - 41 U/L   ALT 20 14 - 54 U/L   Alkaline Phosphatase 52 38 - 126 U/L   Total Bilirubin 1.0 0.3 - 1.2 mg/dL   GFR calc non Af Amer 20 (L) >60 mL/min   GFR calc Af Amer 23 (L) >60 mL/min     Comment: (NOTE) The eGFR has been calculated using the CKD EPI equation. This calculation has not been validated in all clinical situations. eGFR's persistently <60 mL/min signify possible Chronic Kidney Disease.    Anion gap 12 5 - 15  CBC WITH DIFFERENTIAL     Status: Abnormal   Collection Time: 11/15/15  7:40 AM  Result Value Ref Range   WBC 9.8 4.0 - 10.5 K/uL   RBC 5.38 (H) 3.87 - 5.11 MIL/uL   Hemoglobin 15.5 (H) 12.0 - 15.0 g/dL   HCT 43.1 36.0 - 46.0 %   MCV 80.1 78.0 - 100.0 fL   MCH 28.8 26.0 - 34.0 pg   MCHC 36.0 30.0 - 36.0 g/dL   RDW 13.6 11.5 - 15.5 %   Platelets 338 150 - 400 K/uL   Neutrophils Relative % 52 %   Lymphocytes Relative 36 %   Monocytes Relative 11 %   Eosinophils Relative 1 %   Basophils Relative 0 %   Neutro Abs 5.1 1.7 - 7.7 K/uL   Lymphs Abs 3.5 0.7 - 4.0 K/uL   Monocytes Absolute 1.1 (H) 0.1 - 1.0 K/uL   Eosinophils Absolute 0.1 0.0 - 0.7 K/uL   Basophils Absolute 0.0 0.0 - 0.1 K/uL   Smear Review MORPHOLOGY UNREMARKABLE   CK     Status: Abnormal   Collection Time: 11/15/15  7:40 AM  Result Value Ref Range   Total CK 758 (H) 38 - 234 U/L  Urine rapid drug screen (hosp performed)not at [facility]     Status: Abnormal   Collection Time: 11/15/15 10:30 AM  Result Value Ref Range   Opiates NONE DETECTED NONE DETECTED   Cocaine POSITIVE (A) NONE DETECTED   Benzodiazepines NONE DETECTED NONE DETECTED   Amphetamines NONE DETECTED NONE DETECTED   Tetrahydrocannabinol NONE DETECTED NONE DETECTED   Barbiturates POSITIVE (A) NONE DETECTED    Comment:        DRUG SCREEN FOR MEDICAL PURPOSES ONLY.  IF CONFIRMATION IS NEEDED FOR ANY PURPOSE, NOTIFY LAB WITHIN 5 DAYS.        LOWEST DETECTABLE LIMITS FOR URINE DRUG SCREEN Drug Class       Cutoff (ng/mL) Amphetamine      1000 Barbiturate      200 Benzodiazepine   324 Tricyclics       401 Opiates          300 Cocaine          300 THC              50  Sodium, urine, random     Status: None    Collection Time: 11/15/15 10:30 AM  Result Value Ref Range   Sodium, Ur 11 mmol/L    Comment: Performed at [facility]  Creatinine, urine, random     Status: None   Collection Time: 11/15/15 10:30 AM  Result Value Ref Range   Creatinine, Urine 524.34 mg/dL    Comment: RESULTS CONFIRMED BY MANUAL DILUTION Performed at [place] metabolic panel     Status: Abnormal   Collection Time: 11/16/15  4:54 AM  Result Value Ref Range   Sodium 138 135 - 145 mmol/L   Potassium 3.4 (L) 3.5 - 5.1 mmol/L   Chloride 109 101 - 111 mmol/L   CO2 22 22 - 32 mmol/L   Glucose, Bld 103 (H) 65 - 99 mg/dL   BUN 17 6 - 20 mg/dL   Creatinine, Ser 1.45 (H) 0.44 - 1.00 mg/dL    Comment: DELTA CHECK NOTED REPEATED TO VERIFY    Calcium 8.3 (L) 8.9 - 10.3 mg/dL   GFR calc non Af Amer 47 (L) >60 mL/min   GFR calc Af Amer 55 (L) >60 mL/min    Comment: (NOTE) The eGFR has been calculated using the CKD EPI equation. This calculation has not been validated in all clinical situations. eGFR's persistently <60 mL/min signify possible Chronic Kidney Disease.    Anion gap 7 5 - 15  CBC     Status: Abnormal   Collection Time: 11/16/15  4:54 AM  Result Value Ref Range   WBC 5.5 4.0 - 10.5 K/uL   RBC 4.05 3.87 - 5.11 MIL/uL   Hemoglobin 11.5 (L) 12.0 - 15.0 g/dL    Comment: REPEATED TO VERIFY DELTA CHECK NOTED    HCT 33.4 (L) 36.0 - 46.0 %   MCV 82.5 78.0 - 100.0 fL   MCH 28.4 26.0 - 34.0 pg   MCHC 34.4 30.0 - 36.0 g/dL   RDW 13.9 11.5 - 15.5 %   Platelets 304 150 - 400 K/uL  CK     Status: Abnormal   Collection Time: 11/16/15  4:54 AM  Result Value Ref Range   Total CK 498 (H) 38 - 234 U/L    Current Facility-Administered Medications  Medication Dose Route Frequency Provider Last Rate Last Dose  . acetaminophen (TYLENOL) tablet 650 mg  650 mg Oral Q6H PRN Rise Patience, MD       Or  . acetaminophen (TYLENOL) suppository 650 mg  650 mg Rectal Q6H PRN Rise Patience,  MD      . benztropine (COGENTIN) tablet 1 mg  1 mg Oral BID PRN Ambrose Finland, MD       Or  . benztropine mesylate (COGENTIN) injection 1 mg  1 mg Intramuscular BID PRN Ambrose Finland, MD      . cephALEXin (KEFLEX) capsule 500 mg  500 mg Oral Q8H Clanford L Johnson, MD      . citalopram (CELEXA) tablet 20 mg  20 mg Oral Daily Ambrose Finland, MD   20 mg at 11/16/15 1103  . haloperidol lactate (HALDOL) injection 5 mg  5 mg Intramuscular Q6H PRN Ambrose Finland, MD      . heparin injection 5,000 Units  5,000 Units Subcutaneous Q8H Rise Patience, MD   5,000 Units at 11/16/15 0502  . hydrALAZINE (APRESOLINE) injection 10 mg  10 mg Intravenous Q4H PRN Rise Patience, MD      . metroNIDAZOLE (  FLAGYL) tablet 500 mg  500 mg Oral Q12H Clanford L Johnson, MD      . ondansetron (ZOFRAN) tablet 4 mg  4 mg Oral Q6H PRN Rise Patience, MD       Or  . ondansetron [facility]) injection 4 mg  4 mg Intravenous Q6H PRN Rise Patience, MD      . QUEtiapine (SEROQUEL) tablet 50 mg  50 mg Oral BID Ambrose Finland, MD   50 mg at 11/16/15 1103    Musculoskeletal: Strength & Muscle Tone: within normal limits Gait & Station: normal Patient leans: Right  Psychiatric Specialty Exam: Physical Exam as per history and physical   ROS depression, anxiety, leg body pains, and delusional about being pregnant and the acute renal failure.  Blood pressure 109/73, pulse 81, temperature 97.9 F (36.6 C), temperature source Oral, resp. rate 18, weight 77.111 kg (170 lb), last menstrual period 01/15/2015, SpO2 100 %.Body mass index is 26.62 kg/(m^2).  General Appearance: Bizarre, Disheveled and Guarded  Eye Contact:  Fair  Speech:  Blocked and Slow  Volume:  Normal  Mood:  Anxious and Depressed  Affect:  Inappropriate and Labile  Thought Process:  Disorganized and Irrelevant  Orientation:  Full (Time, Place, and Person)  Thought Content:  Illogical, Delusions,  Hallucinations: Auditory Tactile Visual, Paranoid Ideation, Rumination and Tangential  Suicidal Thoughts:  No, not reliable  Homicidal Thoughts:  No  Memory:  Immediate;   Fair Recent;   Fair  Judgement:  Impaired  Insight:  Fair  Psychomotor Activity:  Restlessness  Concentration:  Concentration: Fair and Attention Span: Fair  Recall:  [employer] of Knowledge:  Fair  Language:  Good  Akathisia:  Negative  Handed:  Right  AIMS (if indicated):     Assets:  Communication Skills Desire for Improvement Financial Resources/Insurance Leisure Time Resilience Social Support Transportation  ADL's:  Impaired  Cognition:  Impaired,  Mild  Sleep:        Treatment Plan Summary: Patient presented with disorganized thinking, delusions being pregnant and having infection, increased irritability, agitation, noncompliant with the treatment, refusing take medication and reportedly had multiple psychosocial stresses including substance abuse. Recent substance abuse is cocaine in past substance abuse was marijuana. Patient is known for noncompliant with her medication management over years. Patient was not able to provide contact names and phone numbers during this visit   Case discussed with the staff RN and LCSW and informed that patient continue to meet criteria for acute psychiatric hospitalization for crisis stabilization, safety monitoring and medication management. Patient is currently placed on involuntary commitment and will be transferred to [place] as per LCSW report  Depression: We will start citalopram 20 mg daily Psychosis: We'll start Seroquel 50 mg twice daily and also Haldol 5 mg every 6 hours when necessary for agitation and benztropine 1 mg twice daily by mouth or IM for EPS Substance abuse: Stay sober Psychosocial stresses; referred to the unit social service for cardiac information from the possible family members and also needed psychosocial support  system. Appreciate psychiatric consultation and we sign off as of today due to patient will be transferred to the out of system for inpatient psychiatric services. Please contact [phone] or [phone] if needs further assistance   Disposition: Recommend psychiatric Inpatient admission when medically cleared. Supportive therapy provided about ongoing stressors.  Ambrose Finland, MD 11/16/2015 1:41 PM

## 2015-11-16 NOTE — Progress Notes (Signed)
CSW assisting with d/c planning. Pt has been served. [facility]. Sheriff has been contacted and will provide transport [facility]. Transport will contact [facility] when they are on their way to pick up pt.  Cori RazorJamie Jeanett Antonopoulos LCSW [phone]

## 2015-11-16 NOTE — Discharge Summary (Signed)
Physician Discharge Summary  Alicia DunLatoya Ortwein RUE:[id number] DOB: 1980/10/15 DOA: 11/15/2015  PCP: Pcp Not In System  Admit date: 11/15/2015 Discharge date: 11/16/2015  Admitted From: Home Disposition: Inpatient psychiatric treatment  Recommendations for Outpatient Follow-up:  Follow up with PCP after discharge from inpatient psychiatric treatment   Discharge Condition: Guarded  Brief/Interim Summary: Hospital course: Alicia Flores is a 35 y.o. female with depression, polysubstance abuse [facility] complaining of possible pregnancy and possible UTI was found to be negative for pregnancy and was discharged home on Cipro for UTI [facility] complaining of weakness and possible heat exhaustion. Patient also has been having delusional thoughts of being pregnant and other complaints. Denies any chest pain or shortness of breath nausea vomiting or diarrhea. Patient states she usually takes antidepressants and has been off it for last 2-3 weeks since she ran out of it and hasn't had refills. Denies any suicidal ideation. Patient reported to havetaken cocaine 2 days PTA and smokes cigarettes. Patient's creatinine has been worsening over the last 24 hours and presently is around 3.4 and few hours ago was 2.7.  Assessment & Plan:  1. Acute Renal Failure - likely ATN from dehydration, polysubstance abuse. Creatinine improving after IVFs, CK level trending down. Avoiding nephrotoxins. Renal US WNL.  2. Acute Psychosis - appreciate assistance from inpatient psych team, continue current management. Pt was IVC'd for safety due to ongoing psychosis.  Pt medically stable for transfer to inpatient psych at this time.  3. Polysubstance abuse - Pt transferred to inpatient psychiatric management and intensive drug abuse counseling.   4. UTI and trichomonas - switch to oral treatment 7/5. Urine culture : Mixed flora 5. Hypokalemia - mild,  replete orally today.  DVT prophylaxis: Heparin Code Status: Full Family Communication: No family at bedside Disposition Plan: Pt medically stable for transfer to inpatient psychiatric facility 7/5  Discharge Diagnoses:   Principal Problem:   Psychoses Active Problems:   AKI (acute kidney injury) (HCC)   ARF (acute renal failure) (HCC)   Cocaine abuse with cocaine-induced mood disorder (HCC)   Bipolar I disorder, most recent episode mixed (HCC)   Substance-induced psychotic disorder with hallucinations [facility](HCC)  Discharge Instructions  Discharge Instructions    Increase activity slowly    Complete by:  As directed             Medication List    STOP taking these medications        amantadine 100 MG capsule  Commonly known as:  SYMMETREL     ciprofloxacin 250 MG tablet  Commonly known as:  CIPRO     polyethylene glycol packet  Commonly known as:  MIRALAX / GLYCOLAX      TAKE these medications        cephALEXin 500 MG capsule  Commonly known as:  KEFLEX  Take 1 capsule (500 mg total) by mouth every 8 (eight) hours.  Start taking on:  11/17/2015     citalopram 20 MG tablet  Commonly known as:  CELEXA  Take 1 tablet (20 mg total) by mouth daily.     metroNIDAZOLE 500 MG tablet  Commonly known as:  FLAGYL  Take 1 tablet (500 mg total) by mouth every 12 (twelve) hours.     QUEtiapine 50 MG tablet  Commonly known as:  SEROQUEL  Take 1 tablet (50 mg total) by mouth 2 (two) times daily.  Allergies  Allergen Reactions  . Other     Patient states that she is allergic to all prescription medications.  . Risperidone And Related Other (See Comments)    Excessive weight gain    Consultations:  Inpatient psychiatry  Procedures/Studies: US Renal  11/15/2015  CLINICAL DATA:  Renal failure EXAM: RENAL / URINARY TRACT ULTRASOUND COMPLETE COMPARISON:  None. FINDINGS: Right Kidney: Length: 9.8 cm. Echogenicity within normal limits. No mass or hydronephrosis  visualized. Left Kidney: Length: 9.7 cm. Echogenicity within normal limits. No mass or hydronephrosis visualized. Bladder: Appears normal for degree of bladder distention. IMPRESSION: No evidence of hydronephrosis.  No acute findings. Electronically Signed   By: Charlett Nose M.D.   On: 11/15/2015 07:45    Discharge Exam: Filed Vitals:   11/15/15 1404 11/16/15 0554  BP: 130/69 109/73  Pulse: 90 81  Temp: 97.6 F (36.4 C) 97.9 F (36.6 C)  Resp: 18    Filed Vitals:   11/15/15 0646 11/15/15 1404 11/16/15 0500 11/16/15 0554  BP: 138/87 130/69  109/73  Pulse: 81 90  81  Temp: 97.6 F (36.4 C) 97.6 F (36.4 C)  97.9 F (36.6 C)  TempSrc: Oral Oral  Oral  Resp: 16 18    Weight:   170 lb (77.111 kg)   SpO2: 100% 100%  100%    The results of significant diagnostics from this hospitalization (including imaging, microbiology, ancillary and laboratory) are listed below for reference.     Microbiology: Recent Results (from the past 240 hour(s))  Urine culture     Status: Abnormal   Collection Time: 11/14/15  7:20 PM  Result Value Ref Range Status   Specimen Description URINE, CLEAN CATCH  Final   Special Requests NONE  Final   Culture MULTIPLE SPECIES PRESENT, SUGGEST RECOLLECTION (A)  Final   Report Status 11/16/2015 FINAL  Final     Labs: BNP (last 3 results) No results for input(s): BNP in the last 8760 hours. Basic Metabolic Panel:  Recent Labs Lab 11/14/15 1901 11/15/15 0109 11/15/15 0740 11/16/15 0454  NA 135 135 135 138  K 4.3 4.0 3.5 3.4*  CL 100* 100* 103 109  CO2 24 22 20* 22  GLUCOSE 99 104* 106* 103*  BUN CREATININE 2.71* 3.46* 2.95* 1.45*  CALCIUM 10.4* 10.1 9.5 8.3*   Liver Function Tests:  Recent Labs Lab 11/14/15 1901 11/15/15 0109 11/15/15 0740  AST 30 35 30  ALT ALKPHOS 63 61 52  BILITOT 1.1 1.1 1.0  PROT 10.0* 10.2* 8.8*  ALBUMIN 5.1* 5.1* 4.5    Recent Labs Lab 11/14/15 1901  LIPASE 26  AMYLASE 70   No  results for input(s): AMMONIA in the last 168 hours. CBC:  Recent Labs Lab 11/14/15 1901 11/15/15 0109 11/15/15 0740 11/16/15 0454  WBC 8.9 9.7 9.8 5.5  NEUTROABS  --  6.0 5.1  --   HGB 16.3* 16.0* 15.5* 11.5*  HCT 45.6 45.6 43.1 33.4*  MCV 81.6 82.0 80.1 82.5  PLT 404* 390 338 304   Cardiac Enzymes:  Recent Labs Lab 11/15/15 0740 11/16/15 0454  CKTOTAL 758* 498*   BNP: Invalid input(s): POCBNP CBG: No results for input(s): GLUCAP in the last 168 hours. D-Dimer No results for input(s): DDIMER in the last 72 hours. Hgb A1c No results for input(s): HGBA1C in the last 72 hours. Lipid Profile No results for input(s): CHOL, HDL, LDLCALC, TRIG, CHOLHDL, LDLDIRECT in the last 72 hours.  Thyroid function studies No results for input(s): TSH, T4TOTAL, T3FREE, THYROIDAB in the last 72 hours.  Invalid input(s): FREET3 Anemia work up No results for input(s): VITAMINB12, FOLATE, FERRITIN, TIBC, IRON, RETICCTPCT in the last 72 hours. Urinalysis    Component Value Date/Time   COLORURINE YELLOW 11/14/2015 1920   APPEARANCEUR CLOUDY* 11/14/2015 1920   LABSPEC >1.030* 11/14/2015 1920   PHURINE 5.0 11/14/2015 1920   GLUCOSEU NEGATIVE 11/14/2015 1920   HGBUR TRACE* 11/14/2015 1920   BILIRUBINUR MODERATE* 11/14/2015 1920   KETONESUR 15* 11/14/2015 1920   PROTEINUR 100* 11/14/2015 1920   UROBILINOGEN 1.0 06/19/2012 2140   NITRITE NEGATIVE 11/14/2015 1920   LEUKOCYTESUR TRACE* 11/14/2015 1920   Sepsis Labs Invalid input(s): PROCALCITONIN,  WBC,  LACTICIDVEN Microbiology Recent Results (from the past 240 hour(s))  Urine culture     Status: Abnormal   Collection Time: 11/14/15  7:20 PM  Result Value Ref Range Status   Specimen Description URINE, CLEAN CATCH  Final   Special Requests NONE  Final   Culture MULTIPLE SPECIES PRESENT, SUGGEST RECOLLECTION (A)  Final   Report Status 11/16/2015 FINAL  Final   Time coordinating discharge: 33 minutes  SIGNED:   Standley Dakinslanford Shaquile Lutze,  MD  [facility] 11/16/2015, 11:40 AM Pager   If 7PM-7AM, please contact night-coverage [email or web address] Password [email or web address]

## 2015-11-16 NOTE — Progress Notes (Signed)
LCSWA met with patient at bedside. LCSWA informed patient she has been accepted to [facility]. LCSWA inform patient as process she would be IVC for her safety. Patient said" okay".   MD. Signed IVC paper work. Patient waiting to be served. Patient will be transported by [facility] to [facility]. Patient Mother Levada Dy informed and given contact information.  Kathrin Greathouse, Latanya Presser, MSW Clinical Social Worker 5E and Psychiatric Service Line [phone] 11/16/2015  12:20 PM

## 2015-11-16 NOTE — Progress Notes (Signed)
PROGRESS NOTE    Alicia Flores  [id number]  DOB: May 23, 1980  DOA: 11/15/2015 PCP: Pcp Not In System Outpatient Specialists:  Hospital course: Alicia Flores is a 35 y.o. female with depression, polysubstance abuse [facility] complaining of possible pregnancy and possible UTI was found to be negative for pregnancy and was discharged home on Cipro for UTI [facility] complaining of weakness and possible heat exhaustion. Patient also has been having delusional thoughts of being pregnant and other complaints. Denies any chest pain or shortness of breath nausea vomiting or diarrhea. Patient states she usually takes antidepressants and has been off it for last 2-3 weeks since she ran out of it and hasn't had refills. Denies any suicidal ideation. Patient agrees to have taken cocaine 2 days ago and smokes cigarettes. Patient's creatinine has been worsening over the last 24 hours and presently is around 3.4 and few hours ago was 2.7.  Assessment & Plan:   1. Acute Renal Failure - likely ATN from dehydration, polysubstance abuse. Creatinine improving with IVFs, add morning CK level to labs already drawn.  Avoiding nephrotoxins. Renal US WNL.  2. Acute Psychosis - appreciate assistance from inpatient psych team, continue current management. Pt medically stable for transfer to inpatient psych at this time.  3. Polysubstance abuse - Pt will need inpatient psychiatric management and intensive drug abuse counseling.   4. UTI and trichomonas - switch to oral treatment 7/5.  Urine culture : Mixed flora 5. Hypokalemia - mild,  replete orally today.    DVT prophylaxis: Heparin Code Status: Full Family Communication: No family at bedside Disposition Plan: Pt medically stable for transfer to inpatient psychiatric facility 7/5   Consultants:  psychiatry    Antimicrobials: Anti-infectives    Start     Dose/Rate Route Frequency  Ordered Stop   11/15/15 0830  cefTRIAXone (ROCEPHIN) 1 g in dextrose 5 % 50 mL IVPB     1 g 100 mL/hr over 30 Minutes Intravenous Daily 11/15/15 0749     11/15/15 0800  metroNIDAZOLE (FLAGYL) IVPB 500 mg     500 mg 100 mL/hr over 60 Minutes Intravenous Every 8 hours 11/15/15 0721          Subjective: Pt without complaints this morning. Seems much more lucent and oriented better.   Objective: Filed Vitals:   11/15/15 0646 11/15/15 1404 11/16/15 0500 11/16/15 0554  BP: 138/87 130/69  109/73  Pulse: 81 90  81  Temp: 97.6 F (36.4 C) 97.6 F (36.4 C)  97.9 F (36.6 C)  TempSrc: Oral Oral  Oral  Resp: 16 18    Weight:   170 lb (77.111 kg)   SpO2: 100% 100%  100%    Intake/Output Summary (Last 24 hours) at 11/16/15 0833 Last data filed at 11/15/15 1725  Gross per 24 hour  Intake      0 ml  Output    580 ml  Net   -580 ml   Filed Weights   11/16/15 0500  Weight: 170 lb (77.111 kg)   Exam:  General exam: awake, alert, No apparent distress.  Respiratory system: Clear. No increased work of breathing. Cardiovascular system: S1 & S2 heard, RRR. No JVD, murmurs, gallops, clicks or pedal edema. Gastrointestinal system: Abdomen is nondistended, soft and nontender. Normal bowel sounds heard. Central nervous system: Alert and oriented. No focal neurological deficits. Extremities: No CCE.   Data Reviewed: Basic Metabolic Panel:  Recent  Labs Lab 11/14/15 1901 11/15/15 0109 11/15/15 0740 11/16/15 0454  NA 135 135 135 138  K 4.3 4.0 3.5 3.4*  CL 100* 100* 103 109  CO2 24 22 20* 22  GLUCOSE 99 104* 106* 103*  BUN 18 19 20 17   CREATININE 2.71* 3.46* 2.95* 1.45*  CALCIUM 10.4* 10.1 9.5 8.3*   Liver Function Tests:  Recent Labs Lab 11/14/15 1901 11/15/15 0109 11/15/15 0740  AST 30 35 30  ALT 23 23 20   ALKPHOS 63 61 52  BILITOT 1.1 1.1 1.0  PROT 10.0* 10.2* 8.8*  ALBUMIN 5.1* 5.1* 4.5    Recent Labs Lab 11/14/15 1901  LIPASE 26  AMYLASE 70   No results  for input(s): AMMONIA in the last 168 hours. CBC:  Recent Labs Lab 11/14/15 1901 11/15/15 0109 11/15/15 0740 11/16/15 0454  WBC 8.9 9.7 9.8 5.5  NEUTROABS  --  6.0 5.1  --   HGB 16.3* 16.0* 15.5* 11.5*  HCT 45.6 45.6 43.1 33.4*  MCV 81.6 82.0 80.1 82.5  PLT 404* 390 338 304   Cardiac Enzymes:  Recent Labs Lab 11/15/15 0740  CKTOTAL 758*   BNP (last 3 results) No results for input(s): PROBNP in the last 8760 hours. CBG: No results for input(s): GLUCAP in the last 168 hours.  Recent Results (from the past 240 hour(s))  Urine culture     Status: Abnormal   Collection Time: 11/14/15  7:20 PM  Result Value Ref Range Status   Specimen Description URINE, CLEAN CATCH  Final   Special Requests NONE  Final   Culture MULTIPLE SPECIES PRESENT, SUGGEST RECOLLECTION (A)  Final   Report Status 11/16/2015 FINAL  Final     Studies: [place] Renal  11/15/2015  CLINICAL DATA:  Renal failure EXAM: RENAL / URINARY TRACT ULTRASOUND COMPLETE COMPARISON:  None. FINDINGS: Right Kidney: Length: 9.8 cm. Echogenicity within normal limits. No mass or hydronephrosis visualized. Left Kidney: Length: 9.7 cm. Echogenicity within normal limits. No mass or hydronephrosis visualized. Bladder: Appears normal for degree of bladder distention. IMPRESSION: No evidence of hydronephrosis.  No acute findings. Electronically Signed   By: Charlett NoseKevin  Dover M.D.   On: 11/15/2015 07:45     Scheduled Meds: . cefTRIAXone (ROCEPHIN)  IV  1 g Intravenous Daily  . citalopram  20 mg Oral Daily  . heparin  5,000 Units Subcutaneous Q8H  . metronidazole  500 mg Intravenous Q8H  . QUEtiapine  50 mg Oral BID   Continuous Infusions:   Principal Problem:   Psychoses Active Problems:   AKI (acute kidney injury) (HCC)   ARF (acute renal failure) (HCC)   Cocaine abuse with cocaine-induced mood disorder (HCC)   Bipolar I disorder, most recent episode mixed (HCC)   Substance-induced psychotic disorder with hallucinations  (HCC)   Time spent:    Standley Dakinslanford Johnson, MD, FAAFP [facility] Pager [phone]  If 7PM-7AM, please contact night-coverage [email or web address] Password [email or web address] 11/16/2015, 8:33 AM

## 2015-11-16 NOTE — Progress Notes (Signed)
Report called to [facility].  [facility] 2 [facility] of personal belonigngs. Levora AngelHannah V Pinchas Reither, RN

## 2015-11-17 LAB — HIV ANTIBODY (ROUTINE TESTING W REFLEX): HIV Screen 4th Generation wRfx: NONREACTIVE

## 2017-05-24 IMAGING — US US RENAL
1 series · 14 of 25 positions shown · non-contrast
Comparison: None.

CLINICAL DATA: Renal failure

EXAM:
RENAL / URINARY TRACT ULTRASOUND COMPLETE

[Series 1: us renal · 0.22mm/px · 14 of 58 slices shown]
[im 1/58]
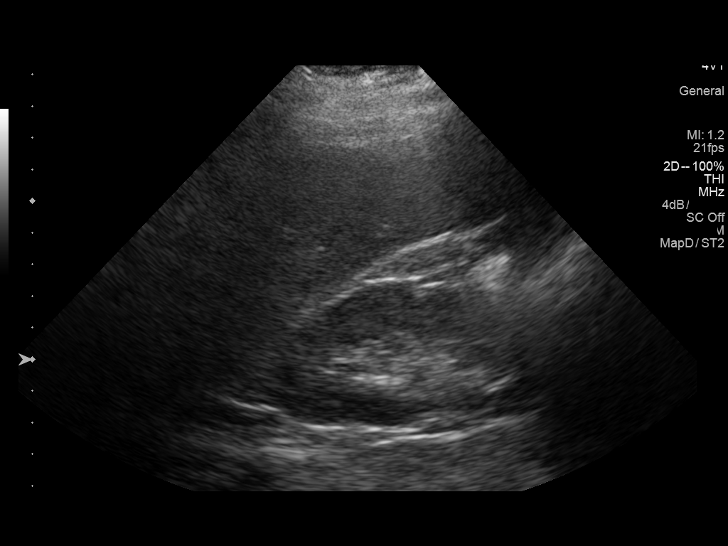
[im 5/58]
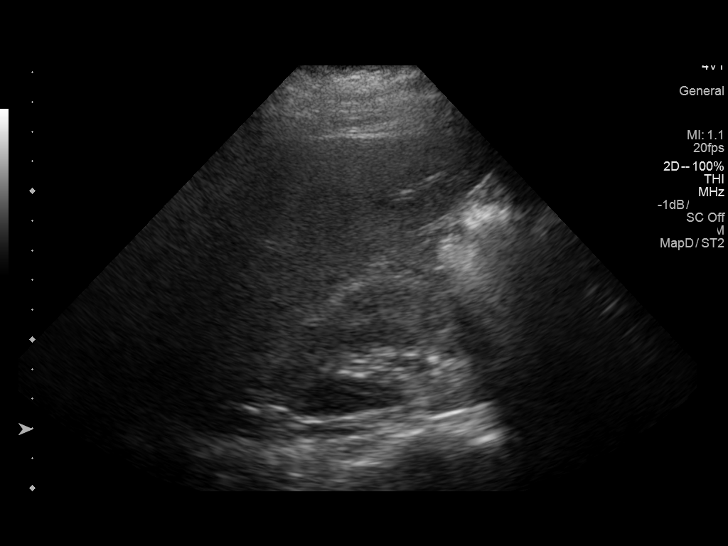
[im 10/58]
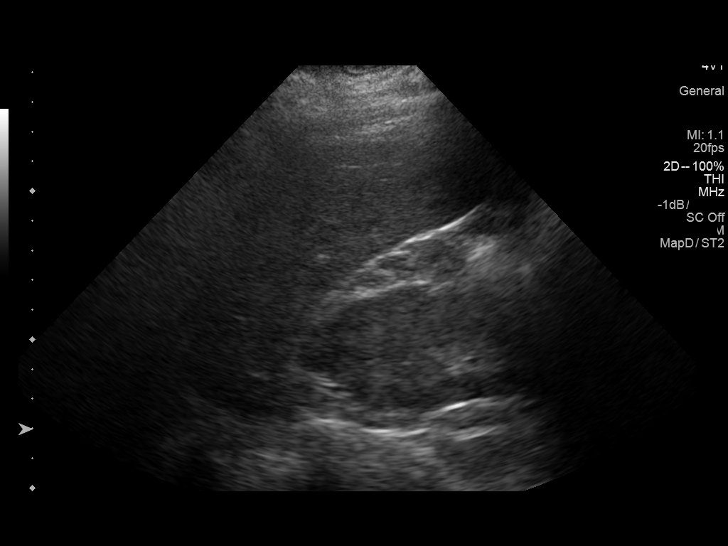
[im 15/58]
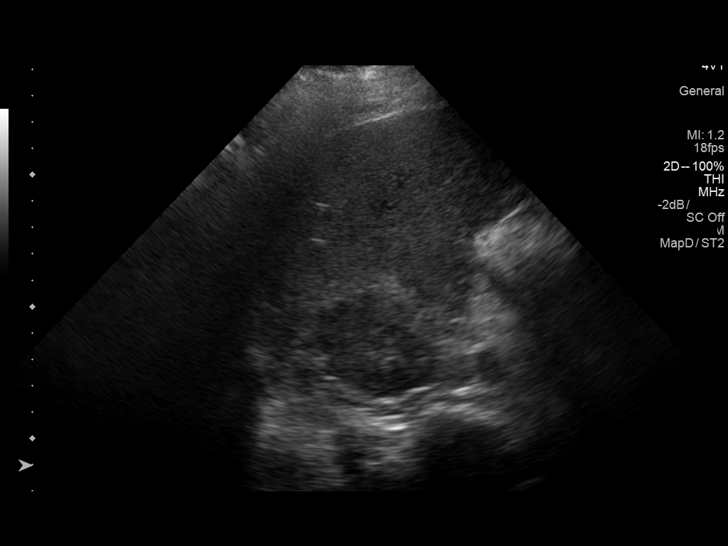
[im 20/58]
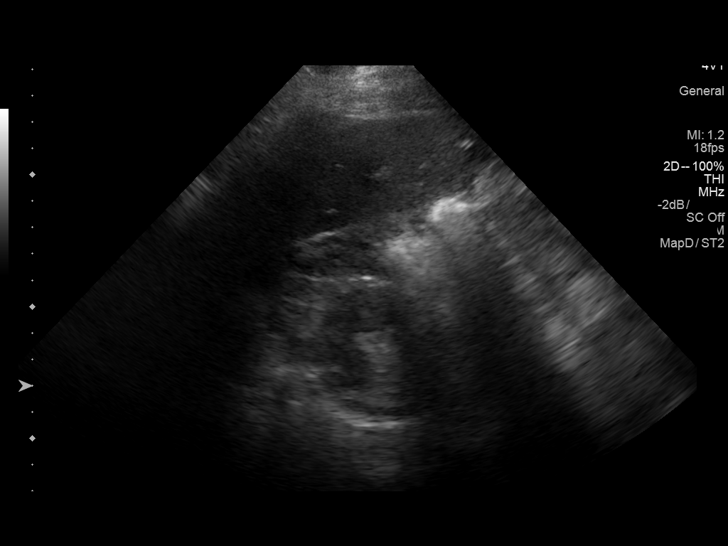
[im 22/58]
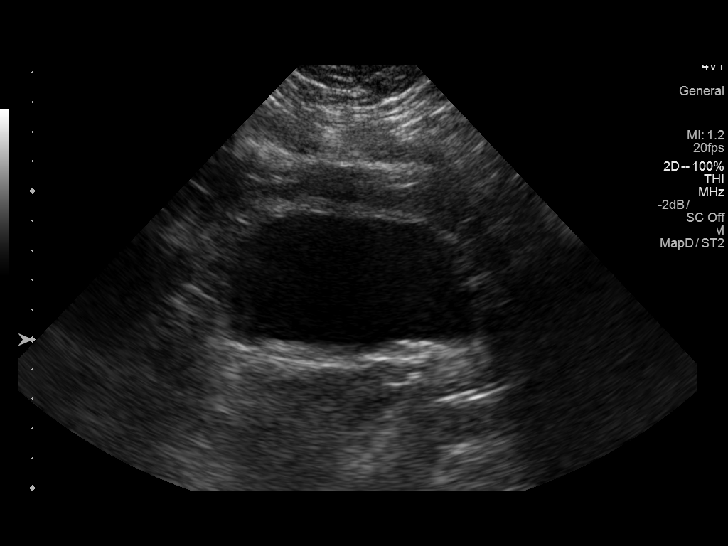
[im 27/58]
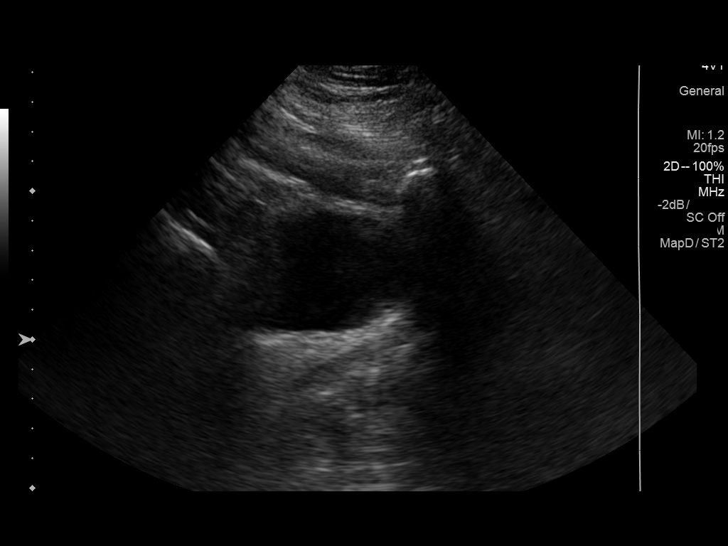
[im 31/58]
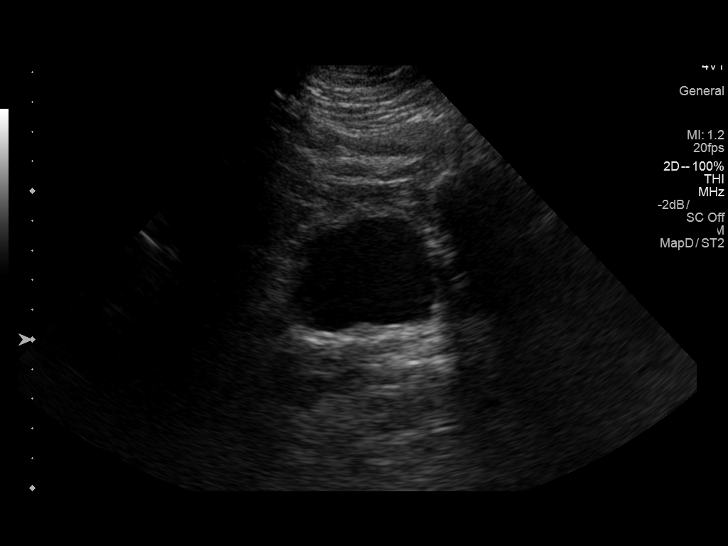
[im 36/58]
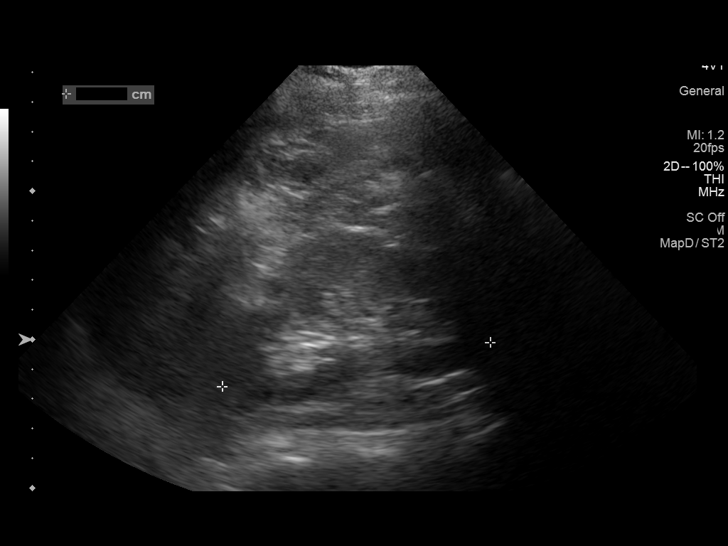
[im 39/58]
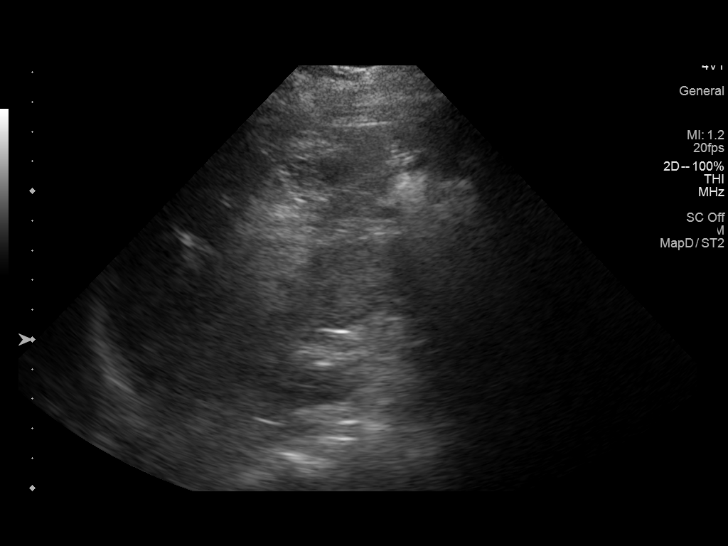
[im 43/58]
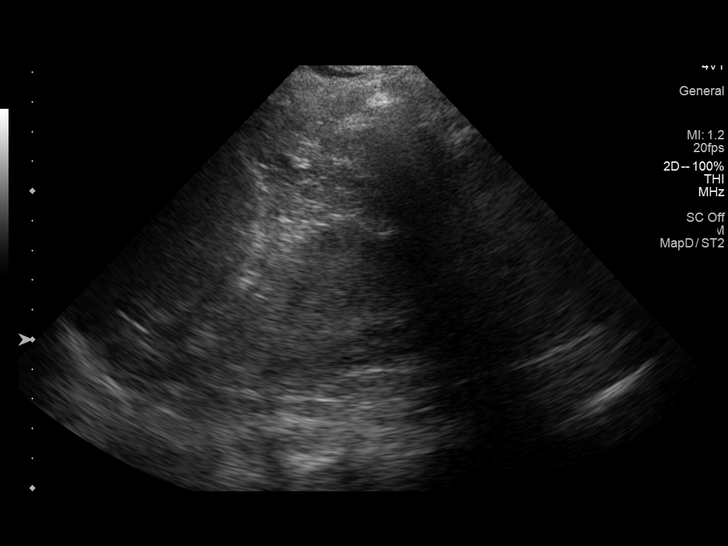
[im 48/58]
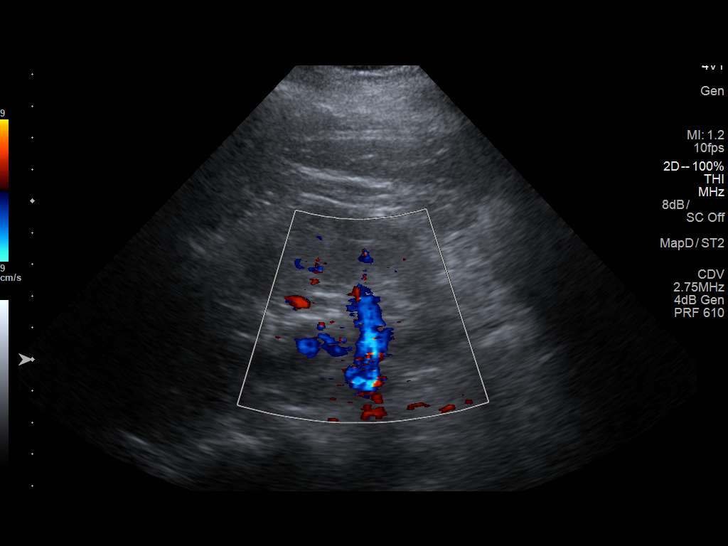
[im 53/58]
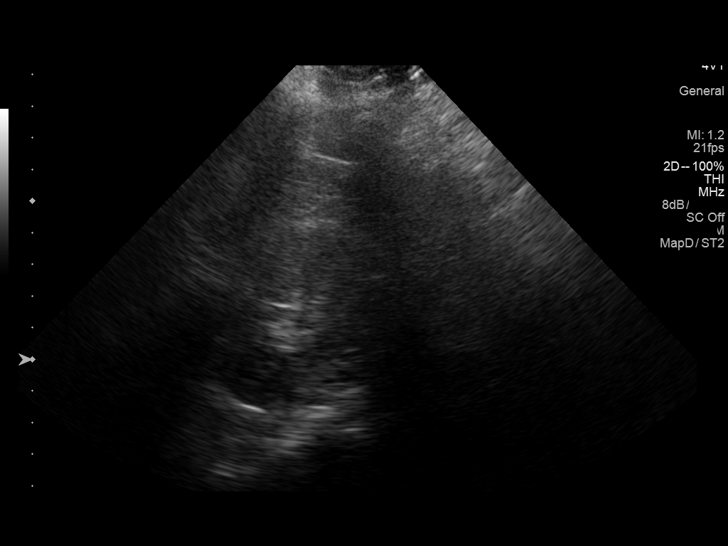
[im 58/58]
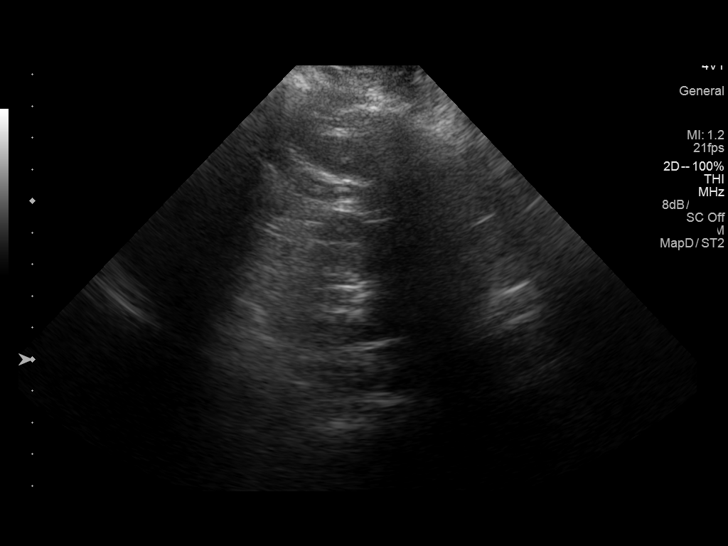

[14 of 25 positions shown; findings below may reference images not displayed]

FINDINGS: Right Kidney:

Length: 9.8 cm. Echogenicity within normal limits. No mass or
hydronephrosis visualized.

Left Kidney:

Length: 9.7 cm. Echogenicity within normal limits. No mass or
hydronephrosis visualized.

Bladder:

Appears normal for degree of bladder distention.
IMPRESSION: No evidence of hydronephrosis.  No acute findings.

## 2021-06-14 DEATH — deceased
